# Patient Record
Sex: Female | Born: 1955 | Race: Black or African American | Hispanic: No | Marital: Single | Smoking: Never smoker
Health system: Southern US, Community
[De-identification: ages and names within clinical notes are randomized; demographics above are authoritative.]

## PROBLEM LIST (undated history)

## (undated) DIAGNOSIS — I10 Essential (primary) hypertension: Secondary | ICD-10-CM

## (undated) DIAGNOSIS — R51 Headache: Secondary | ICD-10-CM

## (undated) DIAGNOSIS — R519 Headache, unspecified: Secondary | ICD-10-CM

## (undated) HISTORY — DX: Essential (primary) hypertension: I10

---

## 2014-10-15 ENCOUNTER — Emergency Department (HOSPITAL_COMMUNITY)
Admission: EM | Admit: 2014-10-15 | Discharge: 2014-10-15 | Disposition: A | Discharge status: Home / Self Care | Payer: Medicaid Other | Attending: Emergency Medicine | Admitting: Emergency Medicine

## 2014-10-15 ENCOUNTER — Emergency Department (INDEPENDENT_AMBULATORY_CARE_PROVIDER_SITE_OTHER)
Admission: EM | Admit: 2014-10-15 | Discharge: 2014-10-15 | Disposition: A | Discharge status: Home / Self Care | Payer: Medicaid Other | Source: Home / Self Care | Attending: Family Medicine | Admitting: Family Medicine

## 2014-10-15 ENCOUNTER — Emergency Department (HOSPITAL_COMMUNITY): Payer: Medicaid Other

## 2014-10-15 ENCOUNTER — Encounter (HOSPITAL_COMMUNITY): Payer: Self-pay | Admitting: *Deleted

## 2014-10-15 ENCOUNTER — Encounter (HOSPITAL_COMMUNITY): Payer: Self-pay | Admitting: Emergency Medicine

## 2014-10-15 DIAGNOSIS — M25512 Pain in left shoulder: Secondary | ICD-10-CM | POA: Diagnosis not present

## 2014-10-15 DIAGNOSIS — J320 Chronic maxillary sinusitis: Secondary | ICD-10-CM | POA: Insufficient documentation

## 2014-10-15 DIAGNOSIS — R519 Headache, unspecified: Secondary | ICD-10-CM

## 2014-10-15 DIAGNOSIS — K5900 Constipation, unspecified: Secondary | ICD-10-CM | POA: Diagnosis not present

## 2014-10-15 DIAGNOSIS — M542 Cervicalgia: Secondary | ICD-10-CM | POA: Insufficient documentation

## 2014-10-15 DIAGNOSIS — K2970 Gastritis, unspecified, without bleeding: Secondary | ICD-10-CM | POA: Insufficient documentation

## 2014-10-15 DIAGNOSIS — G44021 Chronic cluster headache, intractable: Secondary | ICD-10-CM

## 2014-10-15 DIAGNOSIS — G8929 Other chronic pain: Secondary | ICD-10-CM | POA: Insufficient documentation

## 2014-10-15 DIAGNOSIS — R51 Headache: Secondary | ICD-10-CM | POA: Diagnosis present

## 2014-10-15 LAB — COMPREHENSIVE METABOLIC PANEL
ALBUMIN: 3.1 g/dL — AB (ref 3.5–5.0)
ALK PHOS: 117 U/L (ref 38–126)
ALT: 34 U/L (ref 14–54)
ANION GAP: 9 (ref 5–15)
AST: 49 U/L — ABNORMAL HIGH (ref 15–41)
BILIRUBIN TOTAL: 0.8 mg/dL (ref 0.3–1.2)
BUN: 12 mg/dL (ref 6–20)
CO2: 24 mmol/L (ref 22–32)
Calcium: 9.1 mg/dL (ref 8.9–10.3)
Chloride: 104 mmol/L (ref 101–111)
Creatinine, Ser: 0.46 mg/dL (ref 0.44–1.00)
GLUCOSE: 94 mg/dL (ref 65–99)
POTASSIUM: 3.9 mmol/L (ref 3.5–5.1)
Sodium: 137 mmol/L (ref 135–145)
Total Protein: 7.3 g/dL (ref 6.5–8.1)

## 2014-10-15 LAB — CBC WITH DIFFERENTIAL/PLATELET
BASOS ABS: 0 10*3/uL (ref 0.0–0.1)
Basophils Relative: 0 % (ref 0–1)
Eosinophils Absolute: 0.2 10*3/uL (ref 0.0–0.7)
Eosinophils Relative: 3 % (ref 0–5)
HEMATOCRIT: 44.3 % (ref 36.0–46.0)
HEMOGLOBIN: 15.3 g/dL — AB (ref 12.0–15.0)
LYMPHS PCT: 54 % — AB (ref 12–46)
Lymphs Abs: 3.1 10*3/uL (ref 0.7–4.0)
MCH: 30.4 pg (ref 26.0–34.0)
MCHC: 34.5 g/dL (ref 30.0–36.0)
MCV: 87.9 fL (ref 78.0–100.0)
Monocytes Absolute: 0.5 10*3/uL (ref 0.1–1.0)
Monocytes Relative: 8 % (ref 3–12)
NEUTROS ABS: 2 10*3/uL (ref 1.7–7.7)
NEUTROS PCT: 35 % — AB (ref 43–77)
Platelets: 171 10*3/uL (ref 150–400)
RBC: 5.04 MIL/uL (ref 3.87–5.11)
RDW: 12.9 % (ref 11.5–15.5)
WBC: 5.8 10*3/uL (ref 4.0–10.5)

## 2014-10-15 LAB — URINALYSIS, ROUTINE W REFLEX MICROSCOPIC
Bilirubin Urine: NEGATIVE
GLUCOSE, UA: NEGATIVE mg/dL
Hgb urine dipstick: NEGATIVE
Ketones, ur: NEGATIVE mg/dL
NITRITE: NEGATIVE
PROTEIN: NEGATIVE mg/dL
Specific Gravity, Urine: 1.012 (ref 1.005–1.030)
UROBILINOGEN UA: 1 mg/dL (ref 0.0–1.0)
pH: 6.5 (ref 5.0–8.0)

## 2014-10-15 LAB — URINE MICROSCOPIC-ADD ON

## 2014-10-15 MED ORDER — FLUTICASONE PROPIONATE 50 MCG/ACT NA SUSP: 2.0000 | Freq: Every day | NASAL | Status: DC

## 2014-10-15 MED ORDER — ESOMEPRAZOLE MAGNESIUM 40 MG PO CPDR: 40.0000 mg | DELAYED_RELEASE_CAPSULE | Freq: Every day | ORAL | Status: DC

## 2014-10-15 MED ORDER — ACETAMINOPHEN 325 MG PO TABS
650.0000 mg | ORAL_TABLET | Freq: Once | ORAL | Status: AC
  Administered 2014-10-15: 650 mg via ORAL
  Filled 2014-10-15: qty 2

## 2014-10-15 NOTE — Discharge Instructions (Signed)
Gastritis, Adult Gastritis is soreness and swelling (inflammation) of the lining of the stomach. Gastritis can develop as a sudden onset (acute) or long-term (chronic) condition. If gastritis is not treated, it can lead to stomach bleeding and ulcers. CAUSES  Gastritis occurs when the stomach lining is weak or damaged. Digestive juices from the stomach then inflame the weakened stomach lining. The stomach lining may be weak or damaged due to viral or bacterial infections. One common bacterial infection is the Helicobacter pylori infection. Gastritis can also result from excessive alcohol consumption, taking certain medicines, or having too much acid in the stomach.  SYMPTOMS  In some cases, there are no symptoms. When symptoms are present, they may include:  Pain or a burning sensation in the upper abdomen.  Nausea.  Vomiting.  An uncomfortable feeling of fullness after eating. DIAGNOSIS  Your caregiver may suspect you have gastritis based on your symptoms and a physical exam. To determine the cause of your gastritis, your caregiver may perform the following:  Blood or stool tests to check for the H pylori bacterium.  Gastroscopy. A thin, flexible tube (endoscope) is passed down the esophagus and into the stomach. The endoscope has a light and camera on the end. Your caregiver uses the endoscope to view the inside of the stomach.  Taking a tissue sample (biopsy) from the stomach to examine under a microscope. TREATMENT  Depending on the cause of your gastritis, medicines may be prescribed. If you have a bacterial infection, such as an H pylori infection, antibiotics may be given. If your gastritis is caused by too much acid in the stomach, H2 blockers or antacids may be given. Your caregiver may recommend that you stop taking aspirin, ibuprofen, or other nonsteroidal anti-inflammatory drugs (NSAIDs). HOME CARE INSTRUCTIONS  Only take over-the-counter or prescription medicines as directed by  your caregiver.  If you were given antibiotic medicines, take them as directed. Finish them even if you start to feel better.  Drink enough fluids to keep your urine clear or pale yellow.  Avoid foods and drinks that make your symptoms worse, such as:  Caffeine or alcoholic drinks.  Chocolate.  Peppermint or mint flavorings.  Garlic and onions.  Spicy foods.  Citrus fruits, such as oranges, lemons, or limes.  Tomato-based foods such as sauce, chili, salsa, and pizza.  Fried and fatty foods.  Eat small, frequent meals instead of large meals. SEEK IMMEDIATE MEDICAL CARE IF:   You have black or dark red stools.  You vomit blood or material that looks like coffee grounds.  You are unable to keep fluids down.  Your abdominal pain gets worse.  You have a fever.  You do not feel better after 1 week.  You have any other questions or concerns. MAKE SURE YOU:  Understand these instructions.  Will watch your condition.  Will get help right away if you are not doing well or get worse. Document Released: 04/12/2001 Document Revised: 10/18/2011 Document Reviewed: 06/01/2011 [facility] Patient Information 2015 [place], [place]. This information is not intended to replace advice given to you by your health care provider. Make sure you discuss any questions you have with your health care provider.  Sinusitis Sinusitis is redness, soreness, and inflammation of the paranasal sinuses. Paranasal sinuses are air pockets within the bones of your face (beneath the eyes, the middle of the forehead, or above the eyes). In healthy paranasal sinuses, mucus is able to drain out, and air is able to circulate through them by way  of your nose. However, when your paranasal sinuses are inflamed, mucus and air can become trapped. This can allow bacteria and other germs to grow and cause infection. Sinusitis can develop quickly and last only a short time (acute) or continue over a long period (chronic).  Sinusitis that lasts for more than 12 weeks is considered chronic.  CAUSES  Causes of sinusitis include:  Allergies.  Structural abnormalities, such as displacement of the cartilage that separates your nostrils (deviated septum), which can decrease the air flow through your nose and sinuses and affect sinus drainage.  Functional abnormalities, such as when the small hairs (cilia) that line your sinuses and help remove mucus do not work properly or are not present. SIGNS AND SYMPTOMS  Symptoms of acute and chronic sinusitis are the same. The primary symptoms are pain and pressure around the affected sinuses. Other symptoms include:  Upper toothache.  Earache.  Headache.  Bad breath.  Decreased sense of smell and taste.  A cough, which worsens when you are lying flat.  Fatigue.  Fever.  Thick drainage from your nose, which often is green and may contain pus (purulent).  Swelling and warmth over the affected sinuses. DIAGNOSIS  Your health care provider will perform a physical exam. During the exam, your health care provider may:  Look in your nose for signs of abnormal growths in your nostrils (nasal polyps).  Tap over the affected sinus to check for signs of infection.  View the inside of your sinuses (endoscopy) using an imaging device that has a light attached (endoscope). If your health care provider suspects that you have chronic sinusitis, one or more of the following tests may be recommended:  Allergy tests.  Nasal culture. A sample of mucus is taken from your nose, sent to a lab, and screened for bacteria.  Nasal cytology. A sample of mucus is taken from your nose and examined by your health care provider to determine if your sinusitis is related to an allergy. TREATMENT  Most cases of acute sinusitis are related to a viral infection and will resolve on their own within 10 days. Sometimes medicines are prescribed to help relieve symptoms (pain medicine,  decongestants, nasal steroid sprays, or saline sprays).  However, for sinusitis related to a bacterial infection, your health care provider will prescribe antibiotic medicines. These are medicines that will help kill the bacteria causing the infection.  Rarely, sinusitis is caused by a fungal infection. In theses cases, your health care provider will prescribe antifungal medicine. For some cases of chronic sinusitis, surgery is needed. Generally, these are cases in which sinusitis recurs more than 3 times per year, despite other treatments. HOME CARE INSTRUCTIONS   Drink plenty of water. Water helps thin the mucus so your sinuses can drain more easily.  Use a humidifier.  Inhale steam 3 to 4 times a day (for example, sit in the bathroom with the shower running).  Apply a warm, moist washcloth to your face 3 to 4 times a day, or as directed by your health care provider.  Use saline nasal sprays to help moisten and clean your sinuses.  Take medicines only as directed by your health care provider.  If you were prescribed either an antibiotic or antifungal medicine, finish it all even if you start to feel better. SEEK IMMEDIATE MEDICAL CARE IF:  You have increasing pain or severe headaches.  You have nausea, vomiting, or drowsiness.  You have swelling around your face.  You have vision problems.  You have a stiff neck.  You have difficulty breathing. MAKE SURE YOU:   Understand these instructions.  Will watch your condition.  Will get help right away if you are not doing well or get worse. Document Released: 04/18/2005 Document Revised: 09/02/2013 Document Reviewed: 05/03/2011 [facility] Patient Information 2015 [place], [place]. This information is not intended to replace advice given to you by your health care provider. Make sure you discuss any questions you have with your health care provider.

## 2014-10-15 NOTE — ED Notes (Signed)
Pt in c/o neck pain and headaches, went to urgent care for this and sent here for further evaluation, no distress noted

## 2014-10-15 NOTE — ED Provider Notes (Addendum)
CSN: [id number]     Arrival date & time 10/15/14  1437 History   First MD Initiated Contact with Patient 10/15/14 1552     Chief Complaint  Patient presents with  . Headache   (Consider location/radiation/quality/duration/timing/severity/associated sxs/prior Treatment) Patient is a 59 y.o. female presenting with general illness. The history is provided by the patient and the spouse.  Illness Severity:  Mild Onset quality:  Gradual Duration:  2 months Chronicity:  Chronic Context:  Sent from congregation nurse for mult medical issues. Associated symptoms: abdominal pain, chest pain, congestion and headaches     History reviewed. No pertinent past medical history. History reviewed. No pertinent past surgical history. No family history on file. History  Substance Use Topics  . Smoking status: Never Smoker   . Smokeless tobacco: Not on file  . Alcohol Use: No   OB History    No data available     Review of Systems  HENT: Positive for congestion.   Cardiovascular: Positive for chest pain.  Gastrointestinal: Positive for abdominal pain.  Genitourinary: Negative.   Neurological: Positive for weakness and headaches.    Allergies  Review of patient's allergies indicates no known allergies.  Home Medications   Prior to Admission medications   Medication Sig Start Date End Date Taking? Authorizing Provider  esomeprazole (NEXIUM) 40 MG capsule Take 1 capsule (40 mg total) by mouth daily. 10/15/14   Malvin Johns, MD  fluticasone (FLONASE) 50 MCG/ACT nasal spray Place 2 sprays into both nostrils daily. 10/15/14   Malvin Johns, MD   BP 150/81 mmHg  Pulse 72  Temp(Src) 97.9 F (36.6 C) (Oral)  Resp 16  SpO2 98% Physical Exam  Constitutional: She is oriented to person, place, and time. She appears well-developed and well-nourished. No distress.  Neurological: She is alert and oriented to person, place, and time.  Skin: Skin is warm and dry.  Nursing note and vitals  reviewed.   ED Course  Procedures (including critical care time) Labs Review Labs Reviewed - No data to display  Imaging Review No results found.   MDM  No diagnosis found. Sent for eval of mult medical issues.    Billy Fischer, MD 10/15/14 [place], MD 10/15/14 [place], MD 10/18/14 2055

## 2014-10-15 NOTE — ED Notes (Signed)
Provider bedside using translator phone as pt speaks Kinyar[place]anda.

## 2014-10-15 NOTE — ED Notes (Signed)
Multiple complaints: [place] congregational nurse sent patient to ucc.  C/o neck pain, shoulder pain, intermittent fingers numb and tingling.  C/o stomach problems, c/o headaches.  Congregational nurse includes insomnia and reflux and liver problems.

## 2014-10-16 NOTE — ED Provider Notes (Signed)
CSN: [id number]     Arrival date & time 10/15/14  1635 History   First MD Initiated Contact with Patient 10/15/14 2000     Chief Complaint  Patient presents with  . Headache     (Consider location/radiation/quality/duration/timing/severity/associated sxs/prior Treatment) HPI Comments: Patient presents with a headache and stomach pain. She is from [place] and has been here for about one month. She does not speak much [place]. History was obtained through the language line. She reports a headache it's been worsening over the last 2 months. She states that she's had similar headaches for about 5 years. They seem to be a little worse over the last 2 months. She has daily headaches. It's a bifrontal-type headache and she has some facial tenderness as well. She denies any fevers. She does have some pain to the left side of her neck radiating down her left arm. She denies any weakness in the arm. She states it hurts whenever she tries to move her arm. Has been going on about 2 months. She also reports some chronic pain in her upper abdomen. She reports it has been going on for several months as well. She was treated with medications in [place] but doesn't have any medications here. She denies any nausea vomiting or diarrhea. She does report some constipation. She denies any urinary symptoms. She denies any fevers or chills. She was seen [facility]. She was sent over here for further evaluation.  Patient is a 59 y.o. female presenting with headaches.  Headache Associated symptoms: abdominal pain and neck pain   Associated symptoms: no back pain, no congestion, no cough, no diarrhea, no dizziness, no fatigue, no fever, no nausea, no numbness, no vomiting and no weakness     History reviewed. No pertinent past medical history. History reviewed. No pertinent past surgical history. History reviewed. No pertinent family history. History  Substance Use Topics  . Smoking status:  Never Smoker   . Smokeless tobacco: Not on file  . Alcohol Use: No   OB History    No data available     Review of Systems  Constitutional: Negative for fever, chills, diaphoresis and fatigue.  HENT: Negative for congestion, rhinorrhea and sneezing.   Eyes: Negative.   Respiratory: Negative for cough, chest tightness and shortness of breath.   Cardiovascular: Negative for chest pain and leg swelling.  Gastrointestinal: Positive for abdominal pain. Negative for nausea, vomiting, diarrhea and blood in stool.  Genitourinary: Negative for frequency, hematuria, flank pain and difficulty urinating.  Musculoskeletal: Positive for neck pain. Negative for back pain and arthralgias.  Skin: Negative for rash.  Neurological: Positive for headaches. Negative for dizziness, speech difficulty, weakness and numbness.      Allergies  Review of patient's allergies indicates no known allergies.  Home Medications   Prior to Admission medications   Medication Sig Start Date End Date Taking? Authorizing Provider  esomeprazole (NEXIUM) 40 MG capsule Take 1 capsule (40 mg total) by mouth daily. 10/15/14   Malvin Johns, MD  fluticasone (FLONASE) 50 MCG/ACT nasal spray Place 2 sprays into both nostrils daily. 10/15/14   Malvin Johns, MD   BP 133/85 mmHg  Pulse 73  Temp(Src) 97.9 F (36.6 C) (Oral)  Resp 16  SpO2 100% Physical Exam  Constitutional: She is oriented to person, place, and time. She appears well-developed and well-nourished.  HENT:  Head: Normocephalic and atraumatic.  Eyes: Pupils are equal, round, and reactive to light.  Neck: Normal range of  motion. Neck supple.  Tenderness along the left trapezius muscle. No spinal tenderness noted.  Cardiovascular: Normal rate, regular rhythm and normal heart sounds.   Pulmonary/Chest: Effort normal and breath sounds normal. No respiratory distress. She has no wheezes. She has no rales. She exhibits no tenderness.  Abdominal: Soft. Bowel sounds  are normal. There is tenderness (Mild tenderness to epigastrium). There is no rebound and no guarding.  Musculoskeletal: Normal range of motion. She exhibits no edema.  Lymphadenopathy:    She has no cervical adenopathy.  Neurological: She is alert and oriented to person, place, and time. She has normal strength. No cranial nerve deficit or sensory deficit. GCS eye subscore is 4. GCS verbal subscore is 5. GCS motor subscore is 6.  Skin: Skin is warm and dry. No rash noted.  Psychiatric: She has a normal mood and affect.    ED Course  Procedures (including critical care time) Labs Review Labs Reviewed  COMPREHENSIVE METABOLIC PANEL - Abnormal; Notable for the following:    Albumin 3.1 (*)    AST 49 (*)    All other components within normal limits  CBC WITH DIFFERENTIAL/PLATELET - Abnormal; Notable for the following:    Hemoglobin 15.3 (*)    Neutrophils Relative % 35 (*)    Lymphocytes Relative 54 (*)    All other components within normal limits  URINALYSIS, ROUTINE W REFLEX MICROSCOPIC (NOT AT [facility]) - Abnormal; Notable for the following:    Leukocytes, UA TRACE (*)    All other components within normal limits  URINE MICROSCOPIC-ADD ON - Abnormal; Notable for the following:    Squamous Epithelial / LPF FEW (*)    All other components within normal limits    Imaging Review Ct Head Wo Contrast  10/15/2014   CLINICAL DATA:  Frontal headache running down both sides effaced.  EXAM: CT HEAD WITHOUT CONTRAST  TECHNIQUE: Contiguous axial images were obtained from the base of the skull through the vertex without intravenous contrast.  COMPARISON:  None.  FINDINGS: Mild cerebral atrophy. No ventricular dilatation. No mass effect or midline shift. No abnormal extra-axial fluid collections. Gray-white matter junctions are distinct. Basal cisterns are not effaced. No evidence of acute intracranial hemorrhage. No depressed skull fractures. Mucosal thickening in the paranasal sinuses. Suggestion of a  possible air-fluid level in the left maxillary antrum, incompletely visualized. This may indicate sinusitis.  IMPRESSION: No acute intracranial abnormalities. Inflammatory changes in the paranasal sinuses with possible acute air-fluid level in the left maxillary antrum.   Electronically Signed   By: Lucienne Capers M.D.   On: 10/15/2014 21:46     EKG Interpretation None      MDM   Final diagnoses:  Headache, unspecified headache type  Chronic maxillary sinusitis  Gastritis    Patient presents with issues that sound chronic in nature. She has chronic headaches and has evidence of sinus disease on her head CT. I will go ahead and treat her with Flonase for this. She doesn't have any other symptoms that would be more indicative of an acute sinusitis. She also has some epigastric pain which she's had for several years. Her symptoms sound suspicious for gastritis. Her labs are unremarkable other than some mild elevation in her LFTs which she states she's had in the past. I will start her on a PPI. I encouraged her to follow back up at the health clinic and return here as needed for any worsening symptoms.    Malvin Johns, MD 10/16/14 [phone]

## 2014-10-27 ENCOUNTER — Ambulatory Visit (INDEPENDENT_AMBULATORY_CARE_PROVIDER_SITE_OTHER): Payer: Medicaid Other | Admitting: Family Medicine

## 2014-10-27 VITALS — BP 132/74 | HR 87 | Temp 98.1°F | Ht 64.0 in | Wt 162.0 lb

## 2014-10-27 DIAGNOSIS — R1013 Epigastric pain: Secondary | ICD-10-CM

## 2014-10-27 DIAGNOSIS — R079 Chest pain, unspecified: Secondary | ICD-10-CM | POA: Diagnosis not present

## 2014-10-27 DIAGNOSIS — Z008 Encounter for other general examination: Secondary | ICD-10-CM | POA: Diagnosis not present

## 2014-10-27 DIAGNOSIS — Z0289 Encounter for other administrative examinations: Secondary | ICD-10-CM

## 2014-10-27 DIAGNOSIS — E049 Nontoxic goiter, unspecified: Secondary | ICD-10-CM | POA: Diagnosis not present

## 2014-10-27 DIAGNOSIS — Z0000 Encounter for general adult medical examination without abnormal findings: Secondary | ICD-10-CM

## 2014-10-27 DIAGNOSIS — I10 Essential (primary) hypertension: Secondary | ICD-10-CM

## 2014-10-27 MED ORDER — ESOMEPRAZOLE MAGNESIUM 40 MG PO CPDR: 40.0000 mg | DELAYED_RELEASE_CAPSULE | Freq: Every day | ORAL | Status: DC

## 2014-10-27 MED ORDER — ACETAMINOPHEN ER 650 MG PO TBCR: 650.0000 mg | EXTENDED_RELEASE_TABLET | Freq: Three times a day (TID) | ORAL | Status: DC | PRN

## 2014-10-27 NOTE — Progress Notes (Signed)
Patient ID: Stephanie Swanson, female   DOB: [birth year], 58 y.o.   MRN: [id number] [place] from [place] utilized during today's visit.  [place] [facility] Patient Visit  HPI:  Patient presents to [facility] today for a new patient appointment to establish general primary care, also to discuss abdominal pain.  Abdominal pain: notes spasm discomfort in upper abdomen, lower ribs, and chest. Notes some radiation of pain to her left arm with this that is very painful. Describes the abdominal pain as burning followed by spasm. States this improves if she places pressure in the area. Notes pain is worse with starchy foods and now with soft vegetables. Notes the CP is spasm in nature and points to her bilateral breasts with this. Notes this can occur with activity and at rest. Has some intermittent dyspnea with this. Some diaphoresis prior to the CP occurring. Notes some nausea, though no vomiting or diarrhea. Also notes that she has sensation that food gets stuck in her throat though has not had regurgitation and is always able to swallow what she eats. Has been on omeprazole for this with some benefit.  ROS: See HPI  Immigrant Social History: - Name spelling correct?: yes - Date arrived in [place]: 09/11/14 - Country of origin: [place] - Location of refugee camp (if applicable), how long there, and what caused patient to leave home country?: was in refugee camp is [place]. Was there a long time. Left because of the war. - Primary language: kinyarwanda  -Requires intepreter (essentially speaks no [place]) - Education: Highest level of education: no schooling - Prior work: Psychologist, sport and exercise - Games developer name and number: [id number] - Tobacco/alcohol/drug use: no - Marriage Status: widowed - Sexual activity: no - Were you beaten or tortured in your country or refugee camp?  no  - if yes:  Are you having bad dreams about your experience?     Do you feel "jumpy" or "nervous?"     Do you  feel that the experience is happening again?     Are you "super alert" or watchful?   Preventative Care History: -Seen at health department?: yes -has had blood drawn and had immunizations  Past Medical Hx:  -history of stomach issues  Past Surgical Hx:  -no  Family Hx: updated in Epic - Number of family members in [place]:  2 daughters, one son  PHYSICAL EXAM: BP 154/78 mmHg  Pulse 87  Temp(Src) 98.1 F (36.7 C) (Oral)  Ht 5\' 4"  (1.626 m)  Wt 162 lb (73.483 kg)  BMI 27.79 kg/m2 Gen: NAD, resting comfortably on the exam table HEENT: NCAT, MMM, diffusely enlarged goiter noted, no nodules palpated, no OP erythema, no tonsillar swelling Neck:  Supple, goiter noted, no LAD Heart: rrr, no murmur aprpeciated Lungs: CTAB, nml WOB Abdomen: s, mild epigastric TTP, no other tenderness, no guarding or rebound, ND, no HSM noted Skin:  No rash noted MSK: TTP over bilateral lower ribs and over bilateral costochondral joints Neuro: alert, moves all extremities equally Psych: normal affect  Examined and interviewed with Dr. Mingo Amber  FOLLOW UP: F/u in 2 weeks for abdominal discomfort.  Epigastric discomfort Patient with epigastric discomfort and associated chest discomfort most likely related to gastritis and reflux. No peritoneal signs on exam today. EKG performed and reassuring for non-cardiac cause of discomfort. Could also be a muscular issue given TTP of ribs and costochondral joints with spasm sensation. Will check h pylori screen today. Treat if positive. Will continue PPI. Tylenol for possible MSK  component. Given return precautions.   Goiter Goiter noted on exam. Many of her symptoms could be a result of a thyroid issue. Has been seen at HD previously so will request records from them to see what work-up has been initiated to this point.

## 2014-10-27 NOTE — Patient Instructions (Signed)
Nice to meet you. We are going to continue a reflux medication for your abdominal discomfort.  We are going to check for a bacteria that could be causing your pain. We will request your records from the health department.  Please take tylenol as prescribed for pain. If you develop chest pain, shortness of breath, trouble swallowing, abdominal pain, fever, diarrhea, or vomiting please seek medical attention.

## 2014-10-28 DIAGNOSIS — R1013 Epigastric pain: Secondary | ICD-10-CM | POA: Insufficient documentation

## 2014-10-28 DIAGNOSIS — E049 Nontoxic goiter, unspecified: Secondary | ICD-10-CM | POA: Insufficient documentation

## 2014-10-28 DIAGNOSIS — Z0289 Encounter for other administrative examinations: Secondary | ICD-10-CM | POA: Insufficient documentation

## 2014-10-28 NOTE — Assessment & Plan Note (Addendum)
Patient with epigastric discomfort and associated chest discomfort most likely related to gastritis and reflux. No peritoneal signs on exam today. EKG performed and reassuring for non-cardiac cause of discomfort. Could also be a muscular issue given TTP of ribs and costochondral joints with spasm sensation. Will check h pylori screen today. Treat if positive. Will continue PPI. Tylenol for possible MSK component. Given return precautions.

## 2014-10-28 NOTE — Assessment & Plan Note (Addendum)
Goiter noted on exam. Many of her symptoms could be a result of a thyroid issue. Has been seen at HD previously so will request records from them to see what work-up has been initiated to this point.   Addendum:  Borderline low TSH received from HD:  0.811.  T4 was 12.1  Will repeat and refer for [place] and possible endocrinology next visit.

## 2014-10-29 DIAGNOSIS — Z0000 Encounter for general adult medical examination without abnormal findings: Secondary | ICD-10-CM | POA: Insufficient documentation

## 2014-10-29 DIAGNOSIS — I10 Essential (primary) hypertension: Secondary | ICD-10-CM | POA: Insufficient documentation

## 2014-11-21 ENCOUNTER — Encounter: Payer: Self-pay | Admitting: Family Medicine

## 2014-11-21 ENCOUNTER — Ambulatory Visit (INDEPENDENT_AMBULATORY_CARE_PROVIDER_SITE_OTHER): Payer: Medicaid Other | Admitting: Family Medicine

## 2014-11-21 VITALS — BP 138/82 | HR 73 | Temp 98.9°F | Ht 64.0 in | Wt 164.0 lb

## 2014-11-21 DIAGNOSIS — R51 Headache: Secondary | ICD-10-CM | POA: Diagnosis not present

## 2014-11-21 DIAGNOSIS — R519 Headache, unspecified: Secondary | ICD-10-CM | POA: Insufficient documentation

## 2014-11-21 DIAGNOSIS — E049 Nontoxic goiter, unspecified: Secondary | ICD-10-CM

## 2014-11-21 DIAGNOSIS — R1013 Epigastric pain: Secondary | ICD-10-CM

## 2014-11-21 DIAGNOSIS — I10 Essential (primary) hypertension: Secondary | ICD-10-CM

## 2014-11-21 LAB — CBC
HEMATOCRIT: 45.2 % (ref 36.0–46.0)
HEMOGLOBIN: 15.9 g/dL — AB (ref 12.0–15.0)
MCH: 31.2 pg (ref 26.0–34.0)
MCHC: 35.2 g/dL (ref 30.0–36.0)
MCV: 88.8 fL (ref 78.0–100.0)
MPV: 11.9 fL (ref 8.6–12.4)
PLATELETS: 221 10*3/uL (ref 150–400)
RBC: 5.09 MIL/uL (ref 3.87–5.11)
RDW: 13.6 % (ref 11.5–15.5)
WBC: 4.9 10*3/uL (ref 4.0–10.5)

## 2014-11-21 LAB — POCT URINALYSIS DIPSTICK
Bilirubin, UA: NEGATIVE
GLUCOSE UA: NEGATIVE
KETONES UA: NEGATIVE
Nitrite, UA: NEGATIVE
Protein, UA: NEGATIVE
RBC UA: NEGATIVE
Spec Grav, UA: 1.02
Urobilinogen, UA: 1
pH, UA: 8.5

## 2014-11-21 LAB — COMPREHENSIVE METABOLIC PANEL
ALT: 34 U/L (ref 0–35)
AST: 47 U/L — ABNORMAL HIGH (ref 0–37)
Albumin: 3.7 g/dL (ref 3.5–5.2)
Alkaline Phosphatase: 118 U/L — ABNORMAL HIGH (ref 39–117)
BUN: 12 mg/dL (ref 6–23)
CO2: 26 meq/L (ref 19–32)
Calcium: 9.4 mg/dL (ref 8.4–10.5)
Chloride: 101 mEq/L (ref 96–112)
Creat: 0.54 mg/dL (ref 0.50–1.10)
Glucose, Bld: 88 mg/dL (ref 70–99)
Potassium: 4.2 mEq/L (ref 3.5–5.3)
Sodium: 139 mEq/L (ref 135–145)
Total Bilirubin: 0.7 mg/dL (ref 0.2–1.2)
Total Protein: 8.1 g/dL (ref 6.0–8.3)

## 2014-11-21 LAB — POCT UA - MICROSCOPIC ONLY

## 2014-11-21 LAB — POCT H PYLORI SCREEN: H Pylori Screen, POC: POSITIVE

## 2014-11-21 MED ORDER — FLUTICASONE PROPIONATE 50 MCG/ACT NA SUSP: 2.0000 | Freq: Every day | NASAL | Status: DC

## 2014-11-21 MED ORDER — ESOMEPRAZOLE MAGNESIUM 40 MG PO CPDR: 40.0000 mg | DELAYED_RELEASE_CAPSULE | Freq: Every day | ORAL | Status: DC

## 2014-11-21 MED ORDER — ACETAMINOPHEN ER 650 MG PO TBCR: 650.0000 mg | EXTENDED_RELEASE_TABLET | Freq: Three times a day (TID) | ORAL | Status: DC | PRN

## 2014-11-21 NOTE — Progress Notes (Signed)
Subjective:   [name] provided interpretation today for entire visit.  Stephanie Swanson is a 59 y.o. female who presents to [facility] today for several issues:  1.  Abdominal pain:  Also with bloating sensation. Food makes it better, but then pain recurs. Occasional dysphagia/globus sensation. Was taking Nexium but ran out of this 3 days ago and doesn't have any more.  Pain worsened after she ran out of Nexium.  Has nausea without overt vomiting.  States she "spits" a lot.  Occasional blood in AM when she first rinses her mouth.  Denies diarrhea or constipation, but doesn't have BM everyday.    2.  Headaches:  "hurts all the time" in front of face and under her ears.  Has trouble sleeping secondary to this.  Seen in ED with negative CT scan.  Was given Tylenol last visit but didn't pick this up.  She was also given Flonase at last visit and states this helped some with pain.    3.  Goiter: Has temperature intolerance.  NO noticeable weight gain or loss. Occasionally with calf cramps. No LE edema.  Denies palpitations.    ROS as above per HPI.  Occasinally has chest pain when she has abdominal pain, described as "burning sensation" not affected by exertion.  Sometimes worsened with food.   The following portions of the patient's history were reviewed and updated as appropriate: allergies, current medications, past medical history, family and social history, and problem list. Patient is a nonsmoker.    PMH reviewed.  No past medical history on file. No past surgical history on file.  Medications reviewed. Current Outpatient Prescriptions  Medication Sig Dispense Refill  . acetaminophen (TYLENOL) 650 MG CR tablet Take 1 tablet (650 mg total) by mouth every 8 (eight) hours as needed for pain. 90 tablet 2  . esomeprazole (NEXIUM) 40 MG capsule Take 1 capsule (40 mg total) by mouth daily. 30 capsule 3  . fluticasone (FLONASE) 50 MCG/ACT nasal spray Place 2 sprays into both nostrils daily. 16  g 2   No current facility-administered medications for this visit.     Objective:   Physical Exam BP 154/76 mmHg  Pulse 73  Temp(Src) 98.9 F (37.2 C) (Oral)  Ht 5\' 4"  (1.626 m)  Wt 164 lb (74.39 kg)  BMI 28.14 kg/m2 Gen:  Alert, cooperative patient who appears stated age in no acute distress.  Vital signs reviewed. HEENT: EOMI,  MMM Neck:  Goiter noted.  Nontender.  Diffuse Cardiac:  Regular rate and rhythm  Pulm:  Clear to auscultation bilaterally    Abd:  Soft/nondistended/epigastric and some BL upper quadrant tenderness.  Worse on Right.   Exts: Trace BL ankle edema.   No results found for this or any previous visit (from the past 72 hour(s)).

## 2014-11-21 NOTE — Assessment & Plan Note (Signed)
TSH and thyroid US today. Possible Endo referral pending results.

## 2014-11-21 NOTE — Assessment & Plan Note (Signed)
No red flags - has already had negative CT scan. Has not tried any analgesics. Plan Tylenol today. FU in 51month to assess for improvement.  Sooner if worsening. States Flonase helped as well -- possible sinus congestion?

## 2014-11-21 NOTE — Assessment & Plan Note (Addendum)
Checking H pylori today.  Did not happen last visit for some reason. U/A today as well, occasionally with mild lower abdominal tenderness. Cannot palpate spleen on her.   Sending for abdominal US today as well.  There is a concern for splenomegaly in [place] refugees. I will call patient's daughter with results and she will relay them to the patient.  Spent 30 minutes in direct patient care/face to face encounter.

## 2014-11-21 NOTE — Patient Instructions (Addendum)
Take the Esomeprazole for your stomach.  Pick up a refill when you run out of pills.    Take the Tylenol up to 3 times a day if you have a headache.    Refill for Flonase use this 1 spray in each nostril once a day.    I will call your daughter with the test results sometime next week.

## 2014-11-21 NOTE — Assessment & Plan Note (Signed)
Better on recheck 

## 2014-11-22 LAB — T3, FREE: T3 FREE: 3.2 pg/mL (ref 2.3–4.2)

## 2014-11-22 LAB — T4, FREE: Free T4: 0.95 ng/dL (ref 0.80–1.80)

## 2014-11-22 LAB — TSH: TSH: 0.791 u[IU]/mL (ref 0.350–4.500)

## 2014-12-05 ENCOUNTER — Ambulatory Visit (HOSPITAL_COMMUNITY)
Admission: RE | Admit: 2014-12-05 | Discharge: 2014-12-05 | Disposition: A | Discharge status: Home / Self Care | Payer: Medicaid Other | Source: Ambulatory Visit | Attending: Family Medicine | Admitting: Family Medicine

## 2014-12-05 DIAGNOSIS — E049 Nontoxic goiter, unspecified: Secondary | ICD-10-CM

## 2014-12-05 DIAGNOSIS — R1012 Left upper quadrant pain: Secondary | ICD-10-CM | POA: Diagnosis not present

## 2014-12-05 DIAGNOSIS — R1013 Epigastric pain: Secondary | ICD-10-CM | POA: Insufficient documentation

## 2014-12-11 ENCOUNTER — Telehealth: Payer: Self-pay | Admitting: Family Medicine

## 2014-12-11 NOTE — Telephone Encounter (Signed)
Called to discuss lab results with daughter.  Difficulty with thick accent -- both hers and mine.  Long story short, the patient will be returning to see me on 8/23.  Will treat H pylori at that visit.  Will review other labs with her then.

## 2014-12-12 ENCOUNTER — Telehealth: Payer: Self-pay | Admitting: Family Medicine

## 2014-12-12 NOTE — Telephone Encounter (Signed)
Caseworker with [facility] brought in dental form to be completed Will pick up when ready

## 2014-12-12 NOTE — Telephone Encounter (Signed)
Form placed in PCP box. Zimmerman Rumple, Kaytelyn Glore D, CMA  

## 2014-12-15 NOTE — Telephone Encounter (Signed)
Completed and given to [employer]

## 2014-12-16 NOTE — Telephone Encounter (Signed)
Called Aline to inform her that form is complete and ready for pick up.  Phone call was disconnected.  Derl Barrow, RN

## 2014-12-23 ENCOUNTER — Ambulatory Visit (INDEPENDENT_AMBULATORY_CARE_PROVIDER_SITE_OTHER): Payer: Medicaid Other | Admitting: Family Medicine

## 2014-12-23 ENCOUNTER — Encounter: Payer: Self-pay | Admitting: Family Medicine

## 2014-12-23 VITALS — BP 131/73 | HR 73 | Temp 98.0°F | Ht 64.0 in | Wt 165.7 lb

## 2014-12-23 DIAGNOSIS — R1013 Epigastric pain: Secondary | ICD-10-CM

## 2014-12-23 DIAGNOSIS — M25512 Pain in left shoulder: Secondary | ICD-10-CM

## 2014-12-23 DIAGNOSIS — E049 Nontoxic goiter, unspecified: Secondary | ICD-10-CM

## 2014-12-23 MED ORDER — AMOXICILL-CLARITHRO-LANSOPRAZ PO MISC: Freq: Two times a day (BID) | ORAL | Status: DC

## 2014-12-23 NOTE — Patient Instructions (Addendum)
Take each medicine twice daily.  It is three medicines total.  I am sending you for an xray of your shoulder.  -------------------------------------------------------------------------------  I DO NOT SPEAK ENGLISH.  MY DOCTOR WANTS ME TO HAVE A SHOULDER XRAY.  PLEASE HELP ME FIND A [facility] INTERPRETER AND THE RADIOLOGY DEPARTMENT.

## 2014-12-25 DIAGNOSIS — M25512 Pain in left shoulder: Secondary | ICD-10-CM | POA: Insufficient documentation

## 2014-12-25 NOTE — Assessment & Plan Note (Signed)
H pylori found on last visit. Tried to call daughter over the phone, who recommended we discuss in person.  Daughter's husband is present today.  Explained what this meant.   Treat with triple therapy.   FU in ~1 month to assess for improvement.

## 2014-12-25 NOTE — Assessment & Plan Note (Addendum)
Heterogeneously enlarged goiter based on ultrasound results.  No dysphagia or difficulty with airway. Normal TSH and thyroid testing.   Asymptomatic euthyroid patients Follow.  If worsens, will refer to endocrinology.

## 2014-12-25 NOTE — Progress Notes (Signed)
Subjective:   Phone Swahili interpreter Jeneen Rinks through [employer] used today:  Stephanie Swanson is a 59 y.o. female who presents to [facility] today for 2 issues:  1.  Abdominal Pain:  Persistent.  Epigastric.  No N/V. Moving bowels well.  Pain worse after eating.  Describes as sharp stabbing.  Evidently did not pick up the PPI from last visit.    2.  Left shoulder pain:  Worse when using her Left arm or trying to life above her head.  Has been constant for several months.  Relieved when she takes Tylenol.  She is worried the pain is from her liver.  Was evidently told she had trouble with her liver while in [place].  No redness or swelling of shoulder.    ROS as above per HPI, otherwise neg.    The following portions of the patient's history were reviewed and updated as appropriate: allergies, current medications, past medical history, family and social history, and problem list. Patient is a nonsmoker.    PMH reviewed.  No past medical history on file. No past surgical history on file.  Medications reviewed. Current Outpatient Prescriptions  Medication Sig Dispense Refill  . acetaminophen (TYLENOL) 650 MG CR tablet Take 1 tablet (650 mg total) by mouth every 8 (eight) hours as needed for pain. 90 tablet 2  . amoxicillin-clarithromycin-lansoprazole (PREVPAC) combo pack Take by mouth 2 (two) times daily. Follow package directions. 1 kit 0  . esomeprazole (NEXIUM) 40 MG capsule Take 1 capsule (40 mg total) by mouth daily. 30 capsule 3  . fluticasone (FLONASE) 50 MCG/ACT nasal spray Place 2 sprays into both nostrils daily. 16 g 2   No current facility-administered medications for this visit.     Objective:   Physical Exam BP 131/73 mmHg  Pulse 73  Temp(Src) 98 F (36.7 C) (Oral)  Ht 5' 4"  (1.626 m)  Wt 165 lb 11.2 oz (75.161 kg)  BMI 28.43 kg/m2 Gen:  Alert, cooperative patient who appears stated age in no acute distress.  Vital signs reviewed. HEENT: EOMI,  MMM Cardiac:   Regular rate and rhythm  Pulm:  Clear to auscultation bilaterally   Abd:  Soft/nondistended.  TTP in epigastrum.  No RUQ pain.  No guarding or rebound.   MSK:  Some deltoid atrophy on Left.  No redness or effusion palpated.  Difficulty complying with MSK testing secondary both to phone interpreter and pain.  Some mile pain with PROM, moderate to severe with AROM.   Exts: No LE edema  No results found for this or any previous visit (from the past 72 hour(s)).

## 2014-12-25 NOTE — Assessment & Plan Note (Signed)
Difficult to assess with physical exam.  Does have some atrophy. Most likely etiology would be rotator cuff pathology.   Pain relief and strengthening exercises.  Sending for xrays due to possibility of trauma as prior refugee.   Will call with results -- discussed this with her and she asked to be called with Swahili interpreter.

## 2015-01-08 ENCOUNTER — Ambulatory Visit (HOSPITAL_COMMUNITY)
Admission: RE | Admit: 2015-01-08 | Discharge: 2015-01-08 | Disposition: A | Discharge status: Home / Self Care | Payer: Medicaid Other | Source: Ambulatory Visit | Attending: Family Medicine | Admitting: Family Medicine

## 2015-01-08 DIAGNOSIS — M25512 Pain in left shoulder: Secondary | ICD-10-CM | POA: Diagnosis present

## 2015-01-19 ENCOUNTER — Encounter: Payer: Self-pay | Admitting: Family Medicine

## 2015-01-27 ENCOUNTER — Ambulatory Visit: Payer: Medicaid Other | Admitting: Family Medicine

## 2015-01-30 ENCOUNTER — Ambulatory Visit (INDEPENDENT_AMBULATORY_CARE_PROVIDER_SITE_OTHER): Payer: Medicaid Other | Admitting: Family Medicine

## 2015-01-30 ENCOUNTER — Encounter: Payer: Self-pay | Admitting: Family Medicine

## 2015-01-30 VITALS — BP 139/77 | HR 77 | Temp 98.1°F | Ht 64.0 in | Wt 170.3 lb

## 2015-01-30 DIAGNOSIS — R1013 Epigastric pain: Secondary | ICD-10-CM

## 2015-01-30 DIAGNOSIS — G8929 Other chronic pain: Secondary | ICD-10-CM

## 2015-01-30 DIAGNOSIS — R51 Headache: Secondary | ICD-10-CM

## 2015-01-30 DIAGNOSIS — M25512 Pain in left shoulder: Secondary | ICD-10-CM | POA: Diagnosis not present

## 2015-01-30 DIAGNOSIS — IMO0001 Reserved for inherently not codable concepts without codable children: Secondary | ICD-10-CM

## 2015-01-30 DIAGNOSIS — R030 Elevated blood-pressure reading, without diagnosis of hypertension: Secondary | ICD-10-CM | POA: Diagnosis not present

## 2015-01-30 DIAGNOSIS — R519 Headache, unspecified: Secondary | ICD-10-CM

## 2015-01-30 MED ORDER — FLUTICASONE PROPIONATE 50 MCG/ACT NA SUSP: 2.0000 | Freq: Every day | NASAL | Status: DC

## 2015-01-30 MED ORDER — ESOMEPRAZOLE MAGNESIUM 40 MG PO CPDR: 40.0000 mg | DELAYED_RELEASE_CAPSULE | Freq: Every day | ORAL | Status: DC

## 2015-01-30 NOTE — Assessment & Plan Note (Signed)
Persists.   No improvement despite triple therapy for H pylori. I think at this point she warrants further GI evaluation and possible EGD.   Elevated alk phos and gallbladder thickening noted.  Describes pain as bilateral upper quadrants and not consistent with usual gallbladder pain.

## 2015-01-30 NOTE — Progress Notes (Signed)
Subjective:    Stephanie Swanson is a 59 y.o. female who presents to [facility] today for several issues:  1.  Abdominal pain:  Persists. Diagnosed with H.pylori at previous visit.   Treated for this last visit. States she has finished the medicine but still with pain.  She reports she is eating but not as much as she usually does.  Worse with spicy food.  Feels burning in epigastrum.  Some nausea, but no vomiting.    2. Headache:  Bifrontal headache most days of the week.  Worse around her eyes.  Better when she takes nexium.  No changes in vision.  No N/V. No photophobia/phonophobia.    ROS as above per HPI, otherwise neg.    The following portions of the patient's history were reviewed and updated as appropriate: allergies, current medications, past medical history, family and social history, and problem list. Patient is a nonsmoker.    PMH reviewed.  No past medical history on file. No past surgical history on file.  Medications reviewed. Current Outpatient Prescriptions  Medication Sig Dispense Refill  . acetaminophen (TYLENOL) 650 MG CR tablet Take 1 tablet (650 mg total) by mouth every 8 (eight) hours as needed for pain. 90 tablet 2  . amoxicillin-clarithromycin-lansoprazole (PREVPAC) combo pack Take by mouth 2 (two) times daily. Follow package directions. 1 kit 0  . esomeprazole (NEXIUM) 40 MG capsule Take 1 capsule (40 mg total) by mouth daily. 30 capsule 3  . fluticasone (FLONASE) 50 MCG/ACT nasal spray Place 2 sprays into both nostrils daily. 16 g 2   No current facility-administered medications for this visit.     Objective:   Physical Exam BP 139/77 mmHg  Pulse 77  Temp(Src) 98.1 F (36.7 C) (Oral)  Ht _0  (1.626 m)  Wt 170 lb 4.8 oz (77.248 kg)  BMI 29.22 kg/m2 Gen:  Alert, cooperative patient who appears stated age in no acute distress.  Vital signs reviewed. HEENT: EOMI,  MMM Cardiac:  Regular rate and rhythm without murmur auscultated.  Good S1/S2. Pulm:  Clear  to auscultation bilaterally with good air movement.  No wheezes or rales noted.   Abd:  Soft/nondistended.  TTP epigastrum, RUQ, LUQ.  No guarding or rebound.  Otherwise abdomen benign.       No results found for this or any previous visit (from the past 72 hour(s)).

## 2015-01-30 NOTE — Patient Instructions (Signed)
It was good to see you again today.  I have sent in refills for your ulcer and nose medicine.  I am sending you to a stomach doctor today as well.  They will call you with an appointment.

## 2015-01-30 NOTE — Assessment & Plan Note (Signed)
States better with nexium.  Possibly she means Tylenol -- or this is secondary to ulcer/referred pain and truly is better when she takes nexium.  No red flags.   Warning precautions provided.  Recommended restarting Tylenol if pain recurs to see if it helps.

## 2015-01-30 NOTE — Assessment & Plan Note (Signed)
Better with Tylenol. Reviewed xray's with her today and that this is likely MSK pain in origin.

## 2015-01-30 NOTE — Assessment & Plan Note (Signed)
No actual HTN diagnosis.   Monitor.

## 2015-02-02 ENCOUNTER — Encounter: Payer: Self-pay | Admitting: Gastroenterology

## 2015-03-30 ENCOUNTER — Ambulatory Visit (INDEPENDENT_AMBULATORY_CARE_PROVIDER_SITE_OTHER): Payer: Self-pay | Admitting: Gastroenterology

## 2015-03-30 DIAGNOSIS — G8929 Other chronic pain: Secondary | ICD-10-CM

## 2015-03-30 DIAGNOSIS — R1013 Epigastric pain: Secondary | ICD-10-CM

## 2015-03-30 NOTE — Progress Notes (Signed)
No show

## 2015-04-01 ENCOUNTER — Encounter: Payer: Self-pay | Admitting: Gastroenterology

## 2015-04-01 ENCOUNTER — Ambulatory Visit: Payer: Medicaid Other | Admitting: Physician Assistant

## 2015-04-08 ENCOUNTER — Encounter: Payer: Self-pay | Admitting: Gastroenterology

## 2015-06-03 ENCOUNTER — Ambulatory Visit: Payer: Medicaid Other | Admitting: Gastroenterology

## 2015-06-05 ENCOUNTER — Encounter: Payer: Self-pay | Admitting: Physician Assistant

## 2015-06-05 ENCOUNTER — Ambulatory Visit (INDEPENDENT_AMBULATORY_CARE_PROVIDER_SITE_OTHER): Payer: Self-pay | Admitting: Physician Assistant

## 2015-06-05 VITALS — Ht 64.0 in | Wt 176.0 lb

## 2015-06-05 DIAGNOSIS — R1013 Epigastric pain: Secondary | ICD-10-CM

## 2015-06-05 DIAGNOSIS — R6881 Early satiety: Secondary | ICD-10-CM

## 2015-06-05 DIAGNOSIS — Z1211 Encounter for screening for malignant neoplasm of colon: Secondary | ICD-10-CM

## 2015-06-05 MED ORDER — PANTOPRAZOLE SODIUM 40 MG PO TBEC: 40.0000 mg | DELAYED_RELEASE_TABLET | Freq: Every day | ORAL | Status: DC

## 2015-06-05 NOTE — Patient Instructions (Signed)
You have been scheduled for an endoscopy and colonoscopy. Please follow the written instructions given to you at your visit today. We have given you the prep sample. If you use inhalers (even only as needed), please bring them with you on the day of your procedure. Your physician has requested that you go to www.startemmi.com and enter the access code given to you at your visit today. This web site gives a general overview about your procedure. However, you should still follow specific instructions given to you by our office regarding your preparation for the procedure.

## 2015-06-05 NOTE — Progress Notes (Signed)
Patient ID: Westley Hummer, female   DOB: Jul 12, 1955, 60 y.o.   MRN: [id number]   Subjective:    Patient ID: Westley Hummer, female    DOB: Jun 11, 1955, 60 y.o.   MRN: [id number]  HPI  Immacule is a 60 year old African female , [place] refugee relocated to the [place] within the past year. She's non-English-speaking and history was obtained using a phone interpreter. Review of chart showed the patient had been screened for H. pylori by primary care, this was positive (hpylori screen) and she was treated with a Prevpac and twice a day PPI in August/September 2016. Patient states that she continues to have epigastric discomfort and discomfort up under the left breast and in her lower chest. She states she feels "full" all the time and frequently just eats 1 meal per day. No complaints of dysphagia or odynophagia. No nausea or vomiting. Weight has been stable, appetite has been normal. No changes in bowel habits melena or hematochezia. No prior abdominal surgeries. No prior GI history. Family history negative for colon cancer as far she is aware.  Review of Systems Pertinent positive and negative review of systems were noted in the above HPI section.  All other review of systems was otherwise negative.  Outpatient Encounter Prescriptions as of 06/05/2015  Medication Sig  . acetaminophen (TYLENOL) 650 MG CR tablet Take 1 tablet (650 mg total) by mouth every 8 (eight) hours as needed for pain.  Marland Kitchen amoxicillin-clarithromycin-lansoprazole (PREVPAC) combo pack Take by mouth 2 (two) times daily. Follow package directions.  Marland Kitchen esomeprazole (NEXIUM) 40 MG capsule Take 1 capsule (40 mg total) by mouth daily.  . fluticasone (FLONASE) 50 MCG/ACT nasal spray Place 2 sprays into both nostrils daily.  . pantoprazole (PROTONIX) 40 MG tablet Take 1 tablet (40 mg total) by mouth daily.   No facility-administered encounter medications on file as of 06/05/2015.   No Known Allergies Patient Active Problem  List   Diagnosis Date Noted  . Elevated blood pressure 01/30/2015  . Left shoulder pain 12/25/2014  . Bilateral headaches 11/21/2014  . Preventative health care 10/29/2014  . Epigastric discomfort 10/28/2014  . Goiter 10/28/2014  . Refugee health examination 10/28/2014   Social History   Social History  . Marital Status: Single    Spouse Name: N/A  . Number of Children: 4  . Years of Education: N/A   Occupational History  . Not on file.   Social History Main Topics  . Smoking status: Never Smoker   . Smokeless tobacco: Not on file  . Alcohol Use: No  . Drug Use: No  . Sexual Activity: Not on file   Other Topics Concern  . Not on file   Social History Narrative   Immigrant Social History:   - Name spelling correct?: yes   - Date arrived in [place]: 09/11/14   - Country of origin: [place]   - Location of refugee camp (if applicable), how long there, and what caused patient to leave home country?: was in refugee camp is [place]. Was there a long time. Left because of the war.   - Primary language: kinyarwanda   -Requires intepreter (essentially speaks no [place])   - Education: Highest level of education: no schooling   - Prior work: Psychologist, sport and exercise   - Games developer name and number: [id number]   - Tobacco/alcohol/drug use: no   - Marriage Status: widowed   - Sexual activity: no   - Were you beaten or tortured in your country or refugee camp? no   -  if yes: Are you having bad dreams about your experience?    Do you feel "jumpy" or "nervous?"    Do you feel that the experience is happening again?    Are you "super alert" or watchful?     Ms. Oplinger family history is not on file.      Objective:    There were no vitals filed for this visit.  Physical Exam  well-developed African female in no acute distress, non-English speaking. Height 5 foot 4, weight 176. HEENT;  nontraumatic normocephalic EOMI PERRLA sclerae anicteric, Cardiovascular ;regular rate and rhythm with S1-S2 no murmur or gallop, Pulmonary; clear bilaterally, Abdomen ;soft and tender in the epigastrium and left upper quadrant no guarding or rebound no palpable mass or hepatosplenomegaly bowel sounds present, Rectal ;exam not done, Extremities; no clubbing cyanosis or edema skin warm and dry, Neuropsych; mood and affect appropriate     Assessment & Plan:   #1 60 yo African female,[place] refugee, non English speaking with persistent epigastric discomfort and c/o early satiety, no weight loss. No improvement after Rx for Hpylori Etiology not clear, R/o gastropathy/PUD, gastroparesis, gastric lesion #2 colon neoplasia screening- no prior colon  Plan; Start Protonix 40 mg po am Pt will be scheduled for Colonoscopy and EGD  With Dr Silverio Decamp- procedure discussed in detail with pt and she is agreeable to proceed.  If above unrevealing consider GE scan/ Ctabd   Lucresia Simic S Zayvon Alicea PA-C 06/05/2015   Cc: Alveda Reasons, MD

## 2015-06-08 NOTE — Progress Notes (Signed)
Reviewed and agree with documentation and assessment and plan. K. Veena Rifka Ramey , MD   

## 2015-06-09 ENCOUNTER — Telehealth: Payer: Self-pay | Admitting: Family Medicine

## 2015-06-09 NOTE — Telephone Encounter (Signed)
Family member called because they might need a letter or forms filled out so that the patient can get Medicaid again. They are unsure of what they need from [place] but will call the case worker at [facility] to see what the next steps are. Blima Rich

## 2015-06-10 NOTE — Telephone Encounter (Signed)
Will forward to MD to make aware. Keesha Pellum,CMA  

## 2015-06-11 NOTE — Telephone Encounter (Signed)
Ok.  I will be happy to complete any forms they need.

## 2015-07-10 ENCOUNTER — Encounter: Payer: Self-pay | Admitting: Gastroenterology

## 2015-07-18 LAB — POCT LIPID PANEL
HDL: 49
LDL: 69
TC: 138
TRG: 101

## 2015-07-18 LAB — GLUCOSE, POCT (MANUAL RESULT ENTRY)
POC GLUCOSE: 86 mg/dL (ref 70–99)
POC Glucose: 86 mg/dl (ref 70–99)

## 2015-07-22 ENCOUNTER — Encounter: Payer: Self-pay | Admitting: *Deleted

## 2015-07-22 DIAGNOSIS — Z139 Encounter for screening, unspecified: Secondary | ICD-10-CM

## 2015-07-22 LAB — GLUCOSE, POCT (MANUAL RESULT ENTRY): POC Glucose: 184 mg/dl — AB (ref 70–99)

## 2015-07-22 NOTE — Congregational Nurse Program (Unsigned)
Congregational Nurse Program Note  Date of Encounter: 07/22/2015  Past Medical History: No past medical history on file.  Encounter Details:     CNP Questionnaire - 07/22/15 2028    Patient Demographics   Is this a new or existing patient? New   Patient is considered a/an Refugee   Race African   Patient Assistance   Location of Patient [place]   Patient's financial/insurance status Low Income   Uninsured Patient No   Patient referred to apply for the following financial assistance Not Applicable   Food insecurities addressed Not Applicable   Transportation assistance No   Assistance securing medications No   Educational health offerings Hypertension   Encounter Details   Primary purpose of visit Other   Was an Emergency Department visit averted? Not Applicable   Does patient have a medical provider? Yes   Patient referred to Not Applicable   Was a mental health screening completed? (GAINS tool) No   Does patient have dental issues? No   Does patient have vision issues? Yes   Was a vision referral made? No   Since previous encounter, have you referred patient for abnormal blood pressure that resulted in a new diagnosis or medication change? No   Since previous encounter, have you referred patient for abnormal blood glucose that resulted in a new diagnosis or medication change? No       Client came to the center/request from meeting her at the [place] Saturday March 18 @[employer]. The clients BP was much better and her glucose was 184. Through her interpreter the client states she has an appointment with a Dr. Already and appointment was made by her son. She was very thankful, and feeling much better.

## 2015-09-15 ENCOUNTER — Encounter: Payer: Self-pay | Admitting: Gastroenterology

## 2015-09-15 ENCOUNTER — Ambulatory Visit (AMBULATORY_SURGERY_CENTER): Payer: Medicaid Other | Admitting: Gastroenterology

## 2015-09-15 VITALS — BP 139/79 | HR 75 | Temp 98.2°F | Resp 11 | Ht 64.0 in | Wt 176.0 lb

## 2015-09-15 DIAGNOSIS — K219 Gastro-esophageal reflux disease without esophagitis: Secondary | ICD-10-CM

## 2015-09-15 DIAGNOSIS — Z1211 Encounter for screening for malignant neoplasm of colon: Secondary | ICD-10-CM

## 2015-09-15 DIAGNOSIS — D125 Benign neoplasm of sigmoid colon: Secondary | ICD-10-CM

## 2015-09-15 DIAGNOSIS — R12 Heartburn: Secondary | ICD-10-CM

## 2015-09-15 MED ORDER — PANTOPRAZOLE SODIUM 40 MG PO TBEC: 40.0000 mg | DELAYED_RELEASE_TABLET | Freq: Every day | ORAL | Status: DC

## 2015-09-15 MED ORDER — SODIUM CHLORIDE 0.9 % IV SOLN: 500.0000 mL | INTRAVENOUS | Status: DC

## 2015-09-15 NOTE — Op Note (Signed)
[place] Patient Name: Stephanie Swanson Procedure Date: 09/15/2015 4:27 PM MRN: [id number] Endoscopist: Mauri Pole , MD Age: 60 Referring MD:  Date of Birth: 1955-10-01 Gender: Female Procedure:                Upper GI endoscopy Indications:              Dyspepsia, Indigestion, Heartburn Medicines:                Monitored Anesthesia Care Procedure:                Pre-Anesthesia Assessment:                           - Prior to the procedure, a History and Physical                            was performed, and patient medications and                            allergies were reviewed. The patient's tolerance of                            previous anesthesia was also reviewed. The risks                            and benefits of the procedure and the sedation                            options and risks were discussed with the patient.                            All questions were answered, and informed consent                            was obtained. Prior Anticoagulants: The patient has                            taken no previous anticoagulant or antiplatelet                            agents. ASA Grade Assessment: II - A patient with                            mild systemic disease. After reviewing the risks                            and benefits, the patient was deemed in                            satisfactory condition to undergo the procedure.                           After obtaining informed consent, the endoscope was  passed under direct vision. Throughout the                            procedure, the patient's blood pressure, pulse, and                            oxygen saturations were monitored continuously. The                            Model GIF-[id number]) scope was introduced                            through the mouth, and advanced to the second part                            of duodenum. The upper GI  endoscopy was                            accomplished without difficulty. The patient                            tolerated the procedure well. Scope In: Scope Out: Findings:                 The examined esophagus was normal.                           A medium amount of food (residue) was found in the                            gastric fundus.                           The first portion of the duodenum and second                            portion of the duodenum were normal. Complications:            No immediate complications. Estimated Blood Loss:     Estimated blood loss: none. Impression:               - Normal esophagus.                           - A medium amount of food (residue) in the stomach.                           - Normal first portion of the duodenum and second                            portion of the duodenum.                           - No specimens collected. Recommendation:           - Patient has a contact number available for  emergencies. The signs and symptoms of potential                            delayed complications were discussed with the                            patient. Return to normal activities tomorrow.                            Written discharge instructions were provided to the                            patient.                           - Gastroparesis diet indefinitely.                           - Continue present medications.                           - No repeat upper endoscopy.                           -4 hour gastric emptying scan                           - Return to GI office . Mauri Pole, MD 09/15/2015 5:11:20 PM This report has been signed electronically.

## 2015-09-15 NOTE — Progress Notes (Signed)
Called to room to assist during endoscopic procedure.  Patient ID and intended procedure confirmed with present staff. Received instructions for my participation in the procedure from the performing physician.  

## 2015-09-15 NOTE — Op Note (Signed)
[facility] Patient Name: Stephanie Swanson Procedure Date: 09/15/2015 4:29 PM MRN: [id number] Endoscopist: Mauri Pole , MD Age: 60 Referring MD:  Date of Birth: 1955/10/30 Gender: Female Procedure:                Colonoscopy Indications:              Screening for colorectal malignant neoplasm Medicines:                Monitored Anesthesia Care Procedure:                Pre-Anesthesia Assessment:                           - Prior to the procedure, a History and Physical                            was performed, and patient medications and                            allergies were reviewed. The patient's tolerance of                            previous anesthesia was also reviewed. The risks                            and benefits of the procedure and the sedation                            options and risks were discussed with the patient.                            All questions were answered, and informed consent                            was obtained. Prior Anticoagulants: The patient has                            taken no previous anticoagulant or antiplatelet                            agents. ASA Grade Assessment: II - A patient with                            mild systemic disease. After reviewing the risks                            and benefits, the patient was deemed in                            satisfactory condition to undergo the procedure.                           After obtaining informed consent, the colonoscope  was passed under direct vision. Throughout the                            procedure, the patient's blood pressure, pulse, and                            oxygen saturations were monitored continuously. The                            Model CF-HQ190L [id number]) scope was introduced                            through the anus and advanced to the the cecum,                            identified by appendiceal  orifice and ileocecal                            valve. The colonoscopy was performed without                            difficulty. The patient tolerated the procedure                            well. The quality of the bowel preparation was                            adequate to identify polyps 6 mm and larger in                            size. The ileocecal valve, appendiceal orifice, and                            rectum were photographed. Scope In: 4:49:33 PM Scope Out: 5:02:32 PM Scope Withdrawal Time: 0 hours 9 minutes 46 seconds  Total Procedure Duration: 0 hours 12 minutes 59 seconds  Findings:                 The perianal and digital rectal examinations were                            normal. Fair prep.                           A 6 mm polyp was found in the sigmoid colon. The                            polyp was sessile. The polyp was removed with a                            cold snare. Resection and retrieval were complete. Complications:            No immediate complications. Estimated Blood Loss:     Estimated blood loss was minimal. Impression:               -  One 6 mm polyp in the sigmoid colon, removed with                            a cold snare. Resected and retrieved.                           - The examination was otherwise normal. Recommendation:           - Patient has a contact number available for                            emergencies. The signs and symptoms of potential                            delayed complications were discussed with the                            patient. Return to normal activities tomorrow.                            Written discharge instructions were provided to the                            patient.                           - Gastroparesis diet indefinitely.                           - Continue present medications.                           - Await pathology results.                           - Repeat colonoscopy in 5 years for  surveillance                            given the fair prep.                           - Return to GI clinic PRN.                           - For future colonoscopy the patient will require                            an extended preparation. If there are any                            questions, please contact the gastroenterologist. Mauri Pole, MD 09/15/2015 5:08:38 PM This report has been signed electronically.

## 2015-09-15 NOTE — Progress Notes (Signed)
A/ox3 pleased with MAC, report to Celia RN 

## 2015-09-15 NOTE — Patient Instructions (Signed)
Discharge instructions given. Handout on polyps. Resume previous medications. YOU HAD AN ENDOSCOPIC PROCEDURE TODAY AT THE [facility]:   Refer to the procedure report that was given to you for any specific questions about what was found during the examination.  If the procedure report does not answer your questions, please call your gastroenterologist to clarify.  If you requested that your care partner not be given the details of your procedure findings, then the procedure report has been included in a sealed envelope for you to review at your convenience later.  YOU SHOULD EXPECT: Some feelings of bloating in the abdomen. Passage of more gas than usual.  Walking can help get rid of the air that was put into your GI tract during the procedure and reduce the bloating. If you had a lower endoscopy (such as a colonoscopy or flexible sigmoidoscopy) you may notice spotting of blood in your stool or on the toilet paper. If you underwent a bowel prep for your procedure, you may not have a normal bowel movement for a few days.  Please Note:  You might notice some irritation and congestion in your nose or some drainage.  This is from the oxygen used during your procedure.  There is no need for concern and it should clear up in a day or so.  SYMPTOMS TO REPORT IMMEDIATELY:   Following lower endoscopy (colonoscopy or flexible sigmoidoscopy):  Excessive amounts of blood in the stool  Significant tenderness or worsening of abdominal pains  Swelling of the abdomen that is new, acute  Fever of 100F or higher   Following upper endoscopy (EGD)  Vomiting of blood or coffee ground material  New chest pain or pain under the shoulder blades  Painful or persistently difficult swallowing  New shortness of breath  Fever of 100F or higher  Black, tarry-looking stools  For urgent or emergent issues, a gastroenterologist can be reached at any hour by calling [phone].   DIET: Your first  meal following the procedure should be a small meal and then it is ok to progress to your normal diet. Heavy or fried foods are harder to digest and may make you feel nauseous or bloated.  Likewise, meals heavy in dairy and vegetables can increase bloating.  Drink plenty of fluids but you should avoid alcoholic beverages for 24 hours.  ACTIVITY:  You should plan to take it easy for the rest of today and you should NOT DRIVE or use heavy machinery until tomorrow (because of the sedation medicines used during the test).    FOLLOW UP: Our staff will call the number listed on your records the next business day following your procedure to check on you and address any questions or concerns that you may have regarding the information given to you following your procedure. If we do not reach you, we will leave a message.  However, if you are feeling well and you are not experiencing any problems, there is no need to return our call.  We will assume that you have returned to your regular daily activities without incident.  If any biopsies were taken you will be contacted by phone or by letter within the next 1-3 weeks.  Please call us at [phone] if you have not heard about the biopsies in 3 weeks.    SIGNATURES/CONFIDENTIALITY: You and/or your care partner have signed paperwork which will be entered into your electronic medical record.  These signatures attest to the fact that that the information   above on your After Visit Summary has been reviewed and is understood.  Full responsibility of the confidentiality of this discharge information lies with you and/or your care-partner. 

## 2015-09-16 ENCOUNTER — Telehealth: Payer: Self-pay | Admitting: *Deleted

## 2015-09-16 NOTE — Telephone Encounter (Signed)
  Follow up Call- voice mail box full, unable to leave message.

## 2015-09-23 ENCOUNTER — Encounter: Payer: Self-pay | Admitting: Gastroenterology

## 2015-09-24 ENCOUNTER — Other Ambulatory Visit: Payer: Self-pay

## 2015-09-24 DIAGNOSIS — R1013 Epigastric pain: Secondary | ICD-10-CM

## 2015-09-24 DIAGNOSIS — K3189 Other diseases of stomach and duodenum: Secondary | ICD-10-CM

## 2015-10-13 ENCOUNTER — Encounter (HOSPITAL_COMMUNITY): Admission: RE | Admit: 2015-10-13 | Payer: Medicaid Other | Source: Ambulatory Visit

## 2015-10-20 ENCOUNTER — Encounter (HOSPITAL_COMMUNITY)
Admission: RE | Admit: 2015-10-20 | Discharge: 2015-10-20 | Disposition: A | Discharge status: Home / Self Care | Payer: Medicaid Other | Source: Ambulatory Visit | Attending: Gastroenterology | Admitting: Gastroenterology

## 2015-10-20 DIAGNOSIS — K3189 Other diseases of stomach and duodenum: Secondary | ICD-10-CM

## 2015-10-20 DIAGNOSIS — K30 Functional dyspepsia: Secondary | ICD-10-CM | POA: Insufficient documentation

## 2015-10-20 DIAGNOSIS — R1013 Epigastric pain: Secondary | ICD-10-CM | POA: Diagnosis present

## 2015-10-20 MED ORDER — TECHNETIUM TC 99M SULFUR COLLOID
2.0000 | Freq: Once | INTRAVENOUS | Status: AC | PRN
  Administered 2015-10-20: 2 via INTRAVENOUS

## 2015-12-12 ENCOUNTER — Encounter (HOSPITAL_COMMUNITY): Payer: Self-pay | Admitting: Emergency Medicine

## 2015-12-12 ENCOUNTER — Observation Stay (HOSPITAL_COMMUNITY)
Admission: EM | Admit: 2015-12-12 | Discharge: 2015-12-15 | Disposition: A | Discharge status: Home Health | Payer: Medicaid Other | Attending: Internal Medicine | Admitting: Internal Medicine

## 2015-12-12 ENCOUNTER — Emergency Department (HOSPITAL_COMMUNITY): Payer: Medicaid Other

## 2015-12-12 DIAGNOSIS — R4182 Altered mental status, unspecified: Principal | ICD-10-CM | POA: Insufficient documentation

## 2015-12-12 DIAGNOSIS — R945 Abnormal results of liver function studies: Secondary | ICD-10-CM

## 2015-12-12 DIAGNOSIS — E876 Hypokalemia: Secondary | ICD-10-CM | POA: Insufficient documentation

## 2015-12-12 DIAGNOSIS — R262 Difficulty in walking, not elsewhere classified: Secondary | ICD-10-CM | POA: Insufficient documentation

## 2015-12-12 DIAGNOSIS — R7989 Other specified abnormal findings of blood chemistry: Secondary | ICD-10-CM | POA: Insufficient documentation

## 2015-12-12 DIAGNOSIS — R531 Weakness: Secondary | ICD-10-CM | POA: Insufficient documentation

## 2015-12-12 DIAGNOSIS — I10 Essential (primary) hypertension: Secondary | ICD-10-CM | POA: Insufficient documentation

## 2015-12-12 DIAGNOSIS — R51 Headache: Secondary | ICD-10-CM | POA: Insufficient documentation

## 2015-12-12 DIAGNOSIS — R519 Headache, unspecified: Secondary | ICD-10-CM

## 2015-12-12 DIAGNOSIS — K219 Gastro-esophageal reflux disease without esophagitis: Secondary | ICD-10-CM | POA: Diagnosis not present

## 2015-12-12 HISTORY — DX: Headache: R51

## 2015-12-12 HISTORY — DX: Headache, unspecified: R51.9

## 2015-12-12 LAB — CBC
HEMATOCRIT: 44 % (ref 36.0–46.0)
HEMOGLOBIN: 14.7 g/dL (ref 12.0–15.0)
MCH: 30.8 pg (ref 26.0–34.0)
MCHC: 33.4 g/dL (ref 30.0–36.0)
MCV: 92.1 fL (ref 78.0–100.0)
Platelets: 186 10*3/uL (ref 150–400)
RBC: 4.78 MIL/uL (ref 3.87–5.11)
RDW: 12.9 % (ref 11.5–15.5)
WBC: 6.1 10*3/uL (ref 4.0–10.5)

## 2015-12-12 LAB — DIFFERENTIAL
BASOS ABS: 0 10*3/uL (ref 0.0–0.1)
BASOS PCT: 0 %
Eosinophils Absolute: 0 10*3/uL (ref 0.0–0.7)
Eosinophils Relative: 0 %
Lymphocytes Relative: 21 %
Lymphs Abs: 1.3 10*3/uL (ref 0.7–4.0)
Monocytes Absolute: 0.2 10*3/uL (ref 0.1–1.0)
Monocytes Relative: 4 %
NEUTROS ABS: 4.6 10*3/uL (ref 1.7–7.7)
Neutrophils Relative %: 75 %

## 2015-12-12 LAB — COMPREHENSIVE METABOLIC PANEL
ALBUMIN: 3.2 g/dL — AB (ref 3.5–5.0)
ALK PHOS: 99 U/L (ref 38–126)
ALT: 37 U/L (ref 14–54)
ANION GAP: 10 (ref 5–15)
AST: 52 U/L — ABNORMAL HIGH (ref 15–41)
BUN: 10 mg/dL (ref 6–20)
CALCIUM: 8.5 mg/dL — AB (ref 8.9–10.3)
CO2: 20 mmol/L — AB (ref 22–32)
Chloride: 104 mmol/L (ref 101–111)
Creatinine, Ser: 0.82 mg/dL (ref 0.44–1.00)
GFR calc Af Amer: >60 mL/min (ref >60)
GFR calc non Af Amer: >60 mL/min (ref >60)
GLUCOSE: 103 mg/dL — AB (ref 65–99)
Potassium: 4.2 mmol/L (ref 3.5–5.1)
SODIUM: 134 mmol/L — AB (ref 135–145)
Total Bilirubin: 0.6 mg/dL (ref 0.3–1.2)
Total Protein: 7.4 g/dL (ref 6.5–8.1)

## 2015-12-12 LAB — I-STAT TROPONIN, ED: TROPONIN I, POC: 0.04 ng/mL (ref 0.00–0.08)

## 2015-12-12 LAB — I-STAT CHEM 8, ED
BUN: 10 mg/dL (ref 6–20)
Calcium, Ion: 1.07 mmol/L — ABNORMAL LOW (ref 1.12–1.23)
Chloride: 105 mmol/L (ref 101–111)
Creatinine, Ser: 0.7 mg/dL (ref 0.44–1.00)
Glucose, Bld: 100 mg/dL — ABNORMAL HIGH (ref 65–99)
HEMATOCRIT: 44 % (ref 36.0–46.0)
HEMOGLOBIN: 15 g/dL (ref 12.0–15.0)
Potassium: 4.4 mmol/L (ref 3.5–5.1)
SODIUM: 140 mmol/L (ref 135–145)
TCO2: 22 mmol/L (ref 0–100)

## 2015-12-12 LAB — PROTIME-INR
INR: 1.18
Prothrombin Time: 15 seconds (ref 11.4–15.2)

## 2015-12-12 LAB — APTT: APTT: 36 s (ref 24–36)

## 2015-12-12 LAB — CBG MONITORING, ED: Glucose-Capillary: 95 mg/dL (ref 65–99)

## 2015-12-12 MED ORDER — SODIUM CHLORIDE 0.9 % IV SOLN
INTRAVENOUS | Status: DC
  Administered 2015-12-12: 23:00:00 via INTRAVENOUS

## 2015-12-12 MED ORDER — ENOXAPARIN SODIUM 40 MG/0.4ML ~~LOC~~ SOLN
40.0000 mg | SUBCUTANEOUS | Status: DC
  Administered 2015-12-13 – 2015-12-15 (×3): 40 mg via SUBCUTANEOUS
  Filled 2015-12-12 (×4): qty 0.4

## 2015-12-12 MED ORDER — STROKE: EARLY STAGES OF RECOVERY BOOK
Freq: Once | Status: DC
  Filled 2015-12-12: qty 1

## 2015-12-12 NOTE — H&P (Signed)
History and Physical    Stephanie Swanson [id number] DOB: [birth year] DOA: 12/12/2015   PCP: Annabell Sabal, MD Chief Complaint:  Chief Complaint  Patient presents with  . Altered Mental Status    HPI: Stephanie Swanson is a 60 y.o. female with medical history significant of HTN, GERD, takes some HTN med at home but dosent remember what.  Patient brought to ED by family after they found her with AMS on the couch today, LKW 5pm.  Initially per EMS patient responsive to pain only.  ED Course: Since arrival she appears to have improved some.  Now responding to verbal commands with all 4 extremities (commands given by family members who speak her language), but she still hasnt said anything verbally since onset of symptoms.  Concern for stroke.  Apparently she was following commands with right side but not left earlier in the ED this evening.  Review of Systems: Pt non-verbal, unable to perform   Past Medical History:  Diagnosis Date  . Hypertension     History reviewed. No pertinent surgical history.   reports that she has never smoked. She does not have any smokeless tobacco history on file. She reports that she does not drink alcohol or use drugs.  No Known Allergies  No family history on file. 2 daughters 1 son in [place]   Prior to Admission medications   Medication Sig Start Date End Date Taking? Authorizing Provider  pantoprazole (PROTONIX) 40 MG tablet Take 1 tablet (40 mg total) by mouth daily. 09/15/15  Yes Mauri Pole, MD  acetaminophen (TYLENOL) 650 MG CR tablet Take 1 tablet (650 mg total) by mouth every 8 (eight) hours as needed for pain. Patient not taking: Reported on 09/15/2015 11/21/14   Alveda Reasons, MD  esomeprazole (NEXIUM) 40 MG capsule Take 1 capsule (40 mg total) by mouth daily. Patient not taking: Reported on 09/15/2015 01/30/15   Alveda Reasons, MD  fluticasone [facility]) 50 MCG/ACT nasal spray Place 2 sprays into both nostrils daily. Patient  not taking: Reported on 09/15/2015 01/30/15   Alveda Reasons, MD  pantoprazole (PROTONIX) 40 MG tablet Take 1 tablet (40 mg total) by mouth daily. Patient not taking: Reported on 09/15/2015 06/05/15   Alfredia Ferguson, PA-C    Physical Exam: Vitals:   12/12/15 2150 12/12/15 2154 12/12/15 2200  BP:   102/64  Pulse:   84  Resp:   19  Temp:  97.9 F (36.6 C)   TempSrc:  Axillary   SpO2: 90% 93% 94%  Weight:  79.8 kg (176 lb)   Height:  5\' 5"  (1.651 m)       Constitutional: NAD, calm, comfortable Eyes: PERRL, lids and conjunctivae normal ENMT: Mucous membranes are moist. Posterior pharynx clear of any exudate or lesions.Normal dentition.  Neck: normal, supple, no masses, no thyromegaly Respiratory: clear to auscultation bilaterally, no wheezing, no crackles. Normal respiratory effort. No accessory muscle use.  Cardiovascular: Regular rate and rhythm, no murmurs / rubs / gallops. No extremity edema. 2+ pedal pulses. No carotid bruits.  Abdomen: no tenderness, no masses palpated. No hepatosplenomegaly. Bowel sounds positive.  Musculoskeletal: no clubbing / cyanosis. No joint deformity upper and lower extremities. Good ROM, no contractures. Normal muscle tone.  Skin: no rashes, lesions, ulcers. No induration Neurologic: For me she is following instructions with all 4 extremities.  Still no speech since onset of symptoms per family and not responding verbally. Psychiatric: Unable to assess   Labs on Admission: I have personally reviewed  following labs and imaging studies  CBC:  Recent Labs Lab 12/12/15 2210 12/12/15 2237  WBC 6.1  --   NEUTROABS 4.6  --   HGB 14.7 15.0  HCT 44.0 44.0  MCV 92.1  --   PLT 186  --    Basic Metabolic Panel:  Recent Labs Lab 12/12/15 2210 12/12/15 2237  NA 134* 140  K 4.2 4.4  CL 104 105  CO2 20*  --   GLUCOSE 103* 100*  BUN 10 10  CREATININE 0.82 0.70  CALCIUM 8.5*  --    GFR: Estimated Creatinine Clearance: 78 mL/min (by C-G formula  based on SCr of 0.8 mg/dL). Liver Function Tests:  Recent Labs Lab 12/12/15 2210  AST 52*  ALT 37  ALKPHOS 99  BILITOT 0.6  PROT 7.4  ALBUMIN 3.2*   No results for input(s): LIPASE, AMYLASE in the last 168 hours. No results for input(s): AMMONIA in the last 168 hours. Coagulation Profile:  Recent Labs Lab 12/12/15 2239  INR 1.18   Cardiac Enzymes: No results for input(s): CKTOTAL, CKMB, CKMBINDEX, TROPONINI in the last 168 hours. BNP (last 3 results) No results for input(s): PROBNP in the last 8760 hours. HbA1C: No results for input(s): HGBA1C in the last 72 hours. CBG: No results for input(s): GLUCAP in the last 168 hours. Lipid Profile: No results for input(s): CHOL, HDL, LDLCALC, TRIG, CHOLHDL, LDLDIRECT in the last 72 hours. Thyroid Function Tests: No results for input(s): TSH, T4TOTAL, FREET4, T3FREE, THYROIDAB in the last 72 hours. Anemia Panel: No results for input(s): VITAMINB12, FOLATE, FERRITIN, TIBC, IRON, RETICCTPCT in the last 72 hours. Urine analysis:    Component Value Date/Time   COLORURINE YELLOW 10/15/2014 2111   APPEARANCEUR CLEAR 10/15/2014 2111   LABSPEC 1.012 10/15/2014 2111   PHURINE 6.5 10/15/2014 2111   GLUCOSEU NEGATIVE 10/15/2014 2111   HGBUR NEGATIVE 10/15/2014 2111   BILIRUBINUR NEG 11/21/2014 1002   KETONESUR NEGATIVE 10/15/2014 2111   PROTEINUR NEG 11/21/2014 1002   PROTEINUR NEGATIVE 10/15/2014 2111   UROBILINOGEN 1.0 11/21/2014 1002   UROBILINOGEN 1.0 10/15/2014 2111   NITRITE NEG 11/21/2014 1002   NITRITE NEGATIVE 10/15/2014 2111   LEUKOCYTESUR small (1+) (A) 11/21/2014 1002   Sepsis Labs: @LABRCNTIP (procalcitonin:4,lacticidven:4) )No results found for this or any previous visit (from the past 240 hour(s)).   Radiological Exams on Admission: Ct Head Code Stroke W/o Cm  Result Date: 12/12/2015 CLINICAL DATA:  Code stroke. Patient found unresponsive, with altered mental status. Initial encounter. EXAM: CT HEAD WITHOUT  CONTRAST TECHNIQUE: Contiguous axial images were obtained from the base of the skull through the vertex without intravenous contrast. COMPARISON:  CT of the head performed 10/15/2014 FINDINGS: There is no evidence of acute infarction, mass lesion, or intra- or extra-axial hemorrhage on CT. A likely small chronic lacunar infarct is noted at the left external capsule. The posterior fossa, including the cerebellum, brainstem and fourth ventricle, is within normal limits. The third and lateral ventricles are unremarkable in appearance. The cerebral hemispheres are symmetric in appearance, with normal gray-white differentiation. No mass effect or midline shift is seen. There is no evidence of fracture; visualized osseous structures are unremarkable in appearance. The visualized portions of the orbits are within normal limits. The paranasal sinuses and mastoid air cells are well-aerated. No significant soft tissue abnormalities are seen. ASPECTS [facility] Stroke Program Early CT Score, http://www.aspectsinstroke.com) - Ganglionic level infarction (caudate, lentiform nuclei, internal capsule, insula, M1-M3 cortex): 7 - Supraganglionic infarction (M4-M6 cortex): 3 Total score (  0-10 with 10 being normal): 10 IMPRESSION: 1. No acute intracranial pathology seen on CT. 2. 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 3. ASPECTS score 10 These results were called by telephone at the time of interpretation on 12/12/2015 at 11:02 pm to Dr. Nicole Kindred, who verbally acknowledged these results. Electronically Signed   By: Garald Balding M.D.   On: 12/12/2015 23:03    EKG: Independently reviewed.  Assessment/Plan Active Problems:   Altered mental status    1. AMS - 1. Findings and progression in ED is worrisome for stroke 2. MRI brain ordered to evaluate 1. If positive needs the remainder of stroke work up ordered 3. UA pending 4. IVF 2. HTN - holding BP med for now (dont know what it is anyway, BP  running [id number] systolic anyhow.   DVT prophylaxis: Lovenox Code Status: Full Family Communication: At bedside Consults called: Dr. Nicole Kindred has evaluated in ED Admission status: Admit to obs   Etta Quill DO [facility] Pager [phone] from 7PM-7AM  If 7AM-7PM, please contact the day physician for the patient [email or web address] Password [facility]  12/12/2015, 11:51 PM

## 2015-12-12 NOTE — ED Notes (Signed)
Oxygen applied at 2L per order.  Sat on RA noted to be 86%.

## 2015-12-12 NOTE — ED Provider Notes (Signed)
[facility] DEPT Provider Note   CSN: [id number] Arrival date & time: 12/12/15  2146  First Provider Contact:  None     An emergency department physician performed an initial assessment on this suspected stroke patient at 2226.  History   Chief Complaint Chief Complaint  Patient presents with  . Altered Mental Status    HPI Stephanie Swanson is a 60 y.o. female.  Level V caveat for altered mental status. Patient is from the [facility] and there is a language barrier. History obtained from a family friend who has been the interpreter. Patient was apparently normal at 5 PM. At 8 PM she was found listless and uncommunicative. She has a history of hypertension and is taking her antihypertensive medication. This has never happened before.      Past Medical History:  Diagnosis Date  . Hypertension     Patient Active Problem List   Diagnosis Date Noted  . Elevated blood pressure 01/30/2015  . Left shoulder pain 12/25/2014  . Bilateral headaches 11/21/2014  . Preventative health care 10/29/2014  . Epigastric discomfort 10/28/2014  . Goiter 10/28/2014  . Refugee health examination 10/28/2014    History reviewed. No pertinent surgical history.  OB History    No data available       Home Medications    Prior to Admission medications   Medication Sig Start Date End Date Taking? Authorizing Provider  pantoprazole (PROTONIX) 40 MG tablet Take 1 tablet (40 mg total) by mouth daily. 09/15/15  Yes Mauri Pole, MD  acetaminophen (TYLENOL) 650 MG CR tablet Take 1 tablet (650 mg total) by mouth every 8 (eight) hours as needed for pain. Patient not taking: Reported on 09/15/2015 11/21/14   Alveda Reasons, MD  esomeprazole (NEXIUM) 40 MG capsule Take 1 capsule (40 mg total) by mouth daily. Patient not taking: Reported on 09/15/2015 01/30/15   Alveda Reasons, MD  fluticasone [facility]) 50 MCG/ACT nasal spray Place 2 sprays into both nostrils daily. Patient not taking:  Reported on 09/15/2015 01/30/15   Alveda Reasons, MD  pantoprazole (PROTONIX) 40 MG tablet Take 1 tablet (40 mg total) by mouth daily. Patient not taking: Reported on 09/15/2015 06/05/15   Amy S Esterwood, PA-C    Family History No family history on file.  Social History Social History  Substance Use Topics  . Smoking status: Never Smoker  . Smokeless tobacco: Not on file  . Alcohol use No     Allergies   Review of patient's allergies indicates no known allergies.   Review of Systems Review of Systems  Reason unable to perform ROS: Altered mental status.     Physical Exam Updated Vital Signs BP 102/64   Pulse 84   Temp 97.9 F (36.6 C) (Axillary)   Resp 19   Ht 5\' 5"  (1.651 m)   Wt 176 lb (79.8 kg)   SpO2 94%   BMI 29.29 kg/m   Physical Exam  Constitutional: She appears well-developed and well-nourished. No distress.  Does not vocalize to direct questions.  HENT:  Head: Normocephalic and atraumatic.  Eyes: Conjunctivae are normal.  Neck: Neck supple.  Cardiovascular: Normal rate and regular rhythm.   No murmur heard. Pulmonary/Chest: Effort normal and breath sounds normal. No respiratory distress.  Abdominal: Soft. There is no tenderness.  Musculoskeletal:  Unable  Neurological:  Patient does not respond to direct questioning. Extraocular movements are intact. She appears to move her right arm and leg to command, but not her left arm  and leg.  Skin: Skin is warm and dry.  Psychiatric: She has a normal mood and affect.  Nursing note and vitals reviewed.    ED Treatments / Results  Labs (all labs ordered are listed, but only abnormal results are displayed) Labs Reviewed  COMPREHENSIVE METABOLIC PANEL - Abnormal; Notable for the following:       Result Value   Sodium 134 (*)    CO2 20 (*)    Glucose, Bld 103 (*)    Calcium 8.5 (*)    Albumin 3.2 (*)    AST 52 (*)    All other components within normal limits  I-STAT CHEM 8, ED - Abnormal; Notable for  the following:    Glucose, Bld 100 (*)    Calcium, Ion 1.07 (*)    All other components within normal limits  CBC  PROTIME-INR  APTT  DIFFERENTIAL  I-STAT TROPOININ, ED  CBG MONITORING, ED  CBG MONITORING, ED    EKG  EKG Interpretation  Date/Time:  Saturday December 12 2015 21:52:32 EDT Ventricular Rate:  84 PR Interval:    QRS Duration: 78 QT Interval:  372 QTC Calculation: 440 R Axis:   85 Text Interpretation:  Sinus rhythm Borderline right axis deviation Baseline wander in lead(s) V3 Confirmed by Lacinda Axon  MD, Kess Mcilwain ([id number]) on 12/12/2015 10:09:46 PM       Radiology Ct Head Code Stroke W/o Cm  Result Date: 12/12/2015 CLINICAL DATA:  Code stroke. Patient found unresponsive, with altered mental status. Initial encounter. EXAM: CT HEAD WITHOUT CONTRAST TECHNIQUE: Contiguous axial images were obtained from the base of the skull through the vertex without intravenous contrast. COMPARISON:  CT of the head performed 10/15/2014 FINDINGS: There is no evidence of acute infarction, mass lesion, or intra- or extra-axial hemorrhage on CT. A likely small chronic lacunar infarct is noted at the left external capsule. The posterior fossa, including the cerebellum, brainstem and fourth ventricle, is within normal limits. The third and lateral ventricles are unremarkable in appearance. The cerebral hemispheres are symmetric in appearance, with normal gray-white differentiation. No mass effect or midline shift is seen. There is no evidence of fracture; visualized osseous structures are unremarkable in appearance. The visualized portions of the orbits are within normal limits. The paranasal sinuses and mastoid air cells are well-aerated. No significant soft tissue abnormalities are seen. ASPECTS [facility] Stroke Program Early CT Score, http://www.aspectsinstroke.com) - Ganglionic level infarction (caudate, lentiform nuclei, internal capsule, insula, M1-M3 cortex): 7 - Supraganglionic infarction (M4-M6 cortex):  3 Total score (0-10 with 10 being normal): 10 IMPRESSION: 1. No acute intracranial pathology seen on CT. 2. 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 3. ASPECTS score 10 These results were called by telephone at the time of interpretation on 12/12/2015 at 11:02 pm to Dr. Nicole Kindred, who verbally acknowledged these results. Electronically Signed   By: Garald Balding M.D.   On: 12/12/2015 23:03    Procedures Procedures (including critical care time)  Medications Ordered in ED Medications  0.9 %  sodium chloride infusion ( Intravenous New Bag/Given 12/12/15 2236)     Initial Impression / Assessment and Plan / ED Course  I have reviewed the triage vital signs and the nursing notes.  Pertinent labs & imaging results that were available during my care of the patient were reviewed by me and considered in my medical decision making (see chart for details).  Clinical Course    Code stroke was initiated. Patient seen by Dr. Nicole Kindred. I'm concerned  that she may have had a stroke. Discussed with general medicine. Dr. Alcario Drought will evaluate.  Final Clinical Impressions(s) / ED Diagnoses   Final diagnoses:  Altered mental status, unspecified altered mental status type    New Prescriptions New Prescriptions   No medications on file     Nat Christen, MD 12/12/15 2343

## 2015-12-12 NOTE — ED Triage Notes (Signed)
Brought via EMS from home.  Family reports patient was okay around 5pm and was laying on the couch.  Called EMS because she was hard to arouse.   When EMS arrived found responsive to pain only.  Unable to do NIH because patient would not follow commands.  Language barrier noted.  Remi Haggard (not sure of spelling).  In triage very drowsy.  CBG-[id number].

## 2015-12-13 ENCOUNTER — Observation Stay (HOSPITAL_COMMUNITY): Payer: Medicaid Other

## 2015-12-13 DIAGNOSIS — B1920 Unspecified viral hepatitis C without hepatic coma: Secondary | ICD-10-CM | POA: Diagnosis not present

## 2015-12-13 DIAGNOSIS — R51 Headache: Secondary | ICD-10-CM | POA: Diagnosis not present

## 2015-12-13 DIAGNOSIS — E876 Hypokalemia: Secondary | ICD-10-CM | POA: Diagnosis not present

## 2015-12-13 DIAGNOSIS — R4182 Altered mental status, unspecified: Secondary | ICD-10-CM | POA: Diagnosis not present

## 2015-12-13 DIAGNOSIS — R7989 Other specified abnormal findings of blood chemistry: Secondary | ICD-10-CM | POA: Diagnosis not present

## 2015-12-13 LAB — LIPID PANEL
CHOL/HDL RATIO: 3.3 ratio
CHOLESTEROL: 131 mg/dL (ref 0–200)
HDL: 40 mg/dL — ABNORMAL LOW (ref >40)
LDL CALC: 78 mg/dL (ref 0–99)
Triglycerides: 64 mg/dL (ref <150)
VLDL: 13 mg/dL (ref 0–40)

## 2015-12-13 LAB — URINALYSIS, ROUTINE W REFLEX MICROSCOPIC
BILIRUBIN URINE: NEGATIVE
Glucose, UA: NEGATIVE mg/dL
Hgb urine dipstick: NEGATIVE
KETONES UR: 15 mg/dL — AB
NITRITE: NEGATIVE
PH: 5.5 (ref 5.0–8.0)
PROTEIN: NEGATIVE mg/dL
Specific Gravity, Urine: 1.015 (ref 1.005–1.030)

## 2015-12-13 LAB — RAPID URINE DRUG SCREEN, HOSP PERFORMED
Amphetamines: NOT DETECTED
BARBITURATES: NOT DETECTED
BENZODIAZEPINES: NOT DETECTED
COCAINE: NOT DETECTED
OPIATES: NOT DETECTED
Tetrahydrocannabinol: NOT DETECTED

## 2015-12-13 LAB — TSH: TSH: 0.14 u[IU]/mL — AB (ref 0.350–4.500)

## 2015-12-13 LAB — SEDIMENTATION RATE: Sed Rate: 21 mm/hr (ref 0–22)

## 2015-12-13 LAB — GLUCOSE, CAPILLARY: Glucose-Capillary: 88 mg/dL (ref 65–99)

## 2015-12-13 LAB — URINE MICROSCOPIC-ADD ON
BACTERIA UA: NONE SEEN
RBC / HPF: NONE SEEN RBC/hpf (ref 0–5)
SQUAMOUS EPITHELIAL / LPF: NONE SEEN

## 2015-12-13 LAB — ETHANOL

## 2015-12-13 LAB — T4, FREE: FREE T4: 0.9 ng/dL (ref 0.61–1.12)

## 2015-12-13 LAB — AMMONIA: Ammonia: 43 umol/L — ABNORMAL HIGH (ref 9–35)

## 2015-12-13 MED ORDER — LORAZEPAM 2 MG/ML IJ SOLN
INTRAMUSCULAR | Status: AC
  Filled 2015-12-13: qty 1

## 2015-12-13 MED ORDER — LORAZEPAM 2 MG/ML IJ SOLN
1.0000 mg | Freq: Once | INTRAMUSCULAR | Status: AC
  Administered 2015-12-13: 1 mg via INTRAVENOUS

## 2015-12-13 NOTE — Consult Note (Signed)
Admission H&P    Chief Complaint: Altered mental status.  HPI: Stephanie Swanson is an 60 y.o. female with a history of hypertension and GERD brought to the emergency room after being found unresponsive except to pain. Patient was last known well around 5 PM. She has no history of stroke nor TIA. No clear focal deficits were noted except for reduced movement of left upper extremity compared to the right. Code stroke was activated. CT scan of her head was obtained which was unremarkable. Laboratory studies were unremarkable, including glucose of 103. There were no visible signs of trauma. Mental status improved including level of alertness and speech output. She was able to follow commands when translated into her native language by family members. Code stroke was canceled. Recommended admission for acute mental status change, including MRI of the brain and EEG.  Past Medical History:  Diagnosis Date  . Hypertension     History reviewed. No pertinent surgical history.  Family history: Reviewed and noncontributory  Social History:  reports that she has never smoked. She does not have any smokeless tobacco history on file. She reports that she does not drink alcohol or use drugs.  Allergies: No Known Allergies  Medications: Preadmission medications were reviewed by me.  ROS: Unable to obtain due to patient's confusion.  Physical Examination: Blood pressure 102/64, pulse 84, temperature 97.9 F (36.6 C), temperature source Axillary, resp. rate 19, height 5' 5"  (1.651 m), weight 79.8 kg (176 lb), SpO2 94 %.  HEENT-  Normocephalic, no lesions, without obvious abnormality.  Normal external eye and conjunctiva.  Normal TM's bilaterally.  Normal auditory canals and external ears. Normal external nose, mucus membranes and septum.  Normal pharynx. Neck supple with no masses, nodes, nodules or enlargement. Cardiovascular - regular rate and rhythm, S1, S2 normal, no murmur, click, rub or  gallop Lungs - chest clear, no wheezing, rales, normal symmetric air entry Abdomen - soft, non-tender; bowel sounds normal; no masses,  no organomegaly Extremities - no joint deformities, effusion, or inflammation and no edema  Neurologic Examination: Mental Status: Lethargic, with minimal speech output. Able to follow commands when translated by family members. Cranial Nerves: II-Visual fields were normal. III/IV/VI-Pupils were equal and reacted normally to light. Extraocular movements were full and conjugate.    V/VII-no facial numbness and no facial weakness. VIII-normal. X-equivocal mild dysarthria, commensurate with level of alertness. XI: trapezius strength/neck flexion strength normal bilaterally XII-midline tongue extension with normal strength. Motor: 5/5 bilaterally with normal tone and bulk Sensory: Normal throughout. Deep Tendon Reflexes: 1+ and symmetric in upper extremities and absent in lower extremities. Plantars: Flexor bilaterally Cerebellar: Normal finger-to-nose testing. Carotid auscultation: Normal  Results for orders placed or performed during the hospital encounter of 12/12/15 (from the past 48 hour(s))  Comprehensive metabolic panel     Status: Abnormal   Collection Time: 12/12/15 10:10 PM  Result Value Ref Range   Sodium 134 (L) 135 - 145 mmol/L   Potassium 4.2 3.5 - 5.1 mmol/L   Chloride 104 101 - 111 mmol/L   CO2 20 (L) 22 - 32 mmol/L   Glucose, Bld 103 (H) 65 - 99 mg/dL   BUN 10 6 - 20 mg/dL   Creatinine, Ser 0.82 0.44 - 1.00 mg/dL   Calcium 8.5 (L) 8.9 - 10.3 mg/dL   Total Protein 7.4 6.5 - 8.1 g/dL   Albumin 3.2 (L) 3.5 - 5.0 g/dL   AST 52 (H) 15 - 41 U/L   ALT 37 14 - 54  U/L   Alkaline Phosphatase 99 38 - 126 U/L   Total Bilirubin 0.6 0.3 - 1.2 mg/dL   GFR calc non Af Amer >60 >60 mL/min   GFR calc Af Amer >60 >60 mL/min    Comment: (NOTE) The eGFR has been calculated using the CKD EPI equation. This calculation has not been validated in all  clinical situations. eGFR's persistently <60 mL/min signify possible Chronic Kidney Disease.    Anion gap 10 5 - 15  CBC     Status: None   Collection Time: 12/12/15 10:10 PM  Result Value Ref Range   WBC 6.1 4.0 - 10.5 K/uL   RBC 4.78 3.87 - 5.11 MIL/uL   Hemoglobin 14.7 12.0 - 15.0 g/dL   HCT 44.0 36.0 - 46.0 %   MCV 92.1 78.0 - 100.0 fL   MCH 30.8 26.0 - 34.0 pg   MCHC 33.4 30.0 - 36.0 g/dL   RDW 12.9 11.5 - 15.5 %   Platelets 186 150 - 400 K/uL  Differential     Status: None   Collection Time: 12/12/15 10:10 PM  Result Value Ref Range   Neutrophils Relative % 75 %   Neutro Abs 4.6 1.7 - 7.7 K/uL   Lymphocytes Relative 21 %   Lymphs Abs 1.3 0.7 - 4.0 K/uL   Monocytes Relative 4 %   Monocytes Absolute 0.2 0.1 - 1.0 K/uL   Eosinophils Relative 0 %   Eosinophils Absolute 0.0 0.0 - 0.7 K/uL   Basophils Relative 0 %   Basophils Absolute 0.0 0.0 - 0.1 K/uL  I-stat troponin, ED     Status: None   Collection Time: 12/12/15 10:35 PM  Result Value Ref Range   Troponin i, poc 0.04 0.00 - 0.08 ng/mL   Comment 3            Comment: Due to the release kinetics of cTnI, a negative result within the first hours of the onset of symptoms does not rule out myocardial infarction with certainty. If myocardial infarction is still suspected, repeat the test at appropriate intervals.   I-Stat Chem 8, ED     Status: Abnormal   Collection Time: 12/12/15 10:37 PM  Result Value Ref Range   Sodium 140 135 - 145 mmol/L   Potassium 4.4 3.5 - 5.1 mmol/L   Chloride 105 101 - 111 mmol/L   BUN 10 6 - 20 mg/dL   Creatinine, Ser 0.70 0.44 - 1.00 mg/dL   Glucose, Bld 100 (H) 65 - 99 mg/dL   Calcium, Ion 1.07 (L) 1.12 - 1.23 mmol/L   TCO2 22 0 - 100 mmol/L   Hemoglobin 15.0 12.0 - 15.0 g/dL   HCT 44.0 36.0 - 46.0 %  Protime-INR     Status: None   Collection Time: 12/12/15 10:39 PM  Result Value Ref Range   Prothrombin Time 15.0 11.4 - 15.2 seconds   INR 1.18   APTT     Status: None    Collection Time: 12/12/15 10:39 PM  Result Value Ref Range   aPTT 36 24 - 36 seconds   Ct Head Code Stroke W/o Cm  Result Date: 12/12/2015 CLINICAL DATA:  Code stroke. Patient found unresponsive, with altered mental status. Initial encounter. EXAM: CT HEAD WITHOUT CONTRAST TECHNIQUE: Contiguous axial images were obtained from the base of the skull through the vertex without intravenous contrast. COMPARISON:  CT of the head performed 10/15/2014 FINDINGS: There is no evidence of acute infarction, mass lesion, or intra- or extra-axial  hemorrhage on CT. A likely small chronic lacunar infarct is noted at the left external capsule. The posterior fossa, including the cerebellum, brainstem and fourth ventricle, is within normal limits. The third and lateral ventricles are unremarkable in appearance. The cerebral hemispheres are symmetric in appearance, with normal gray-white differentiation. No mass effect or midline shift is seen. There is no evidence of fracture; visualized osseous structures are unremarkable in appearance. The visualized portions of the orbits are within normal limits. The paranasal sinuses and mastoid air cells are well-aerated. No significant soft tissue abnormalities are seen. ASPECTS [facility] Stroke Program Early CT Score, http://www.aspectsinstroke.com) - Ganglionic level infarction (caudate, lentiform nuclei, internal capsule, insula, M1-M3 cortex): 7 - Supraganglionic infarction (M4-M6 cortex): 3 Total score (0-10 with 10 being normal): 10 IMPRESSION: 1. No acute intracranial pathology seen on CT. 2. 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 3. ASPECTS score 10 These results were called by telephone at the time of interpretation on 12/12/2015 at 11:02 pm to Dr. Nicole Kindred, who verbally acknowledged these results. Electronically Signed   By: Garald Balding M.D.   On: 12/12/2015 23:03    Assessment/Plan 60 year old lady presenting with altered mental status of unclear  etiology. Patient's level of alertness improved after arriving in the emergency room. Onset of mental status changes was not witnessed. Stroke is unlikely but cannot be completely ruled out at this point. She has no clinical signs of stroke, however. Possible seizure with postictal state is also a consideration.  Recommendations: 1. MRI of the brain without contrast 2. Stroke workup including risk assessment if MRI shows acute stroke 3. EEG routine adult study  We will continue to follow this patient with you.  C.R. Nicole Kindred, MD [facility] [phone]  12/13/2015, 12:17 AM

## 2015-12-13 NOTE — ED Notes (Signed)
IV fluid stopped at this time for MRI.

## 2015-12-13 NOTE — ED Notes (Signed)
Attempted report at this time.  Nurse to call back when available. 

## 2015-12-13 NOTE — Progress Notes (Signed)
NURSING PROGRESS NOTE  Stephanie Swanson [id number] Admission Data: 12/13/2015 6:27 AM Attending Provider: Florencia Reasons, MD [id number], MD Code Status: Full  Stephanie Swanson is a 60 y.o. female patient admitted from ED:  -No acute distress noted, however patient is non-verbal at the time of admit.   Cardiac Monitoring: Box # 21 in place. Cardiac monitor yields:normal sinus rhythm.  Blood pressure (!) 116/59, pulse 75, temperature 98.3 F (36.8 C), temperature source Oral, resp. rate 16, height 5\' 5"  (1.651 m), weight 79.8 kg (176 lb), SpO2 100 %.   IV Fluids:  IV in place, occlusive dsg intact without redness, IV cath hand left, condition patent and no redness Finishing 500 ml normal saline bolus that began in the ED.   Allergies:  Review of patient's allergies indicates no known allergies.  Past Medical History:   has a past medical history of Hypertension.  Past Surgical History:   has no past surgical history on file.  Social History:   reports that she has never smoked. She does not have any smokeless tobacco history on file. She reports that she does not drink alcohol or use drugs.  Skin: intact  Patient/Family orientated to room. Information packet given to patient/family. Admission inpatient armband information verified with patient/family to include name and date of birth and placed on patient arm. Side rails up x 2, fall assessment and education completed with patient/family. Patient/family able to verbalize understanding of risk associated with falls and verbalized understanding to call for assistance before getting out of bed. Call light within reach. Patient/family able to voice and demonstrate understanding of unit orientation instructions.    Will continue to evaluate and treat per MD orders.

## 2015-12-13 NOTE — ED Notes (Signed)
Attempted report once again. Nurse to call back when available.

## 2015-12-13 NOTE — ED Notes (Signed)
IV fluid stopped during MRI.  Restarted at this time.

## 2015-12-13 NOTE — Progress Notes (Signed)
Due to patient's nonverbal response and language barrier, the NIH scale was unable to be completed. Daughter and son is at the bedside. Daughter speaks and understands English, however, there are obvious barriers noted when communicating with her.

## 2015-12-13 NOTE — Progress Notes (Signed)
PROGRESS NOTE  Stephanie Swanson [id number] DOB: [birth year] DOA: 12/12/2015 PCP: Annabell Sabal, MD  HPI/Recap of past 24 hours:  More alert this am, start talking, c/o headache ,  oriented to person, not to place or time, states could not remember what had happened  Patient does not speak english, daughter in room help translate Per daughter, patient has chronic daily headache for the last 5years Patient was acting normal until 5pm yesterday afternoon when she started to staring and not talking, she get worse around 8pm when they call the EMS  Assessment/Plan: Active Problems:   Altered mental status    Confusion: MRI brain no stroke. Broad differential including migraine headache variant, seizure, metabolic encephalopathy. Family report patient does not drink alcohol, no illicit drug use or herbal medicine. Denies h/o seizure activities. Will get ua/uds/ammonia/b12/thiamine/tsh/hiv/rpr/esr/crp Still very lethargic, but per family she is more responsive compare to yesterday, Swallow eval pending   Mild elevation of lft: check hepatitis panel/live [place]   Code Status: full  Family Communication: patient , daughter and son in room, daughter speaks english as a Building surveyor requested work note which is provided  Disposition Plan: pending   Consultants:  neurology  Procedures:  EEG  Antibiotics:  none   Objective: BP (!) 116/59 (BP Location: Right Arm)   Pulse 75   Temp 98.3 F (36.8 C) (Oral)   Resp 16   Ht 5' 5"  (1.651 m)   Wt 79.8 kg (176 lb)   SpO2 100%   BMI 29.29 kg/m  No intake or output data in the 24 hours ending 12/13/15 0826 Filed Weights   12/12/15 2154  Weight: 79.8 kg (176 lb)    Exam:   General:  Lethargic, following commands, started to talk to family, only oriented to person  Cardiovascular: RRR  Respiratory: CTABL  Abdomen: Soft/ND/NT, positive BS  Musculoskeletal: No Edema  Neuro: Lethargic, following commands,  started to talk to family, only oriented to person, generalized lower extremity weakness, no focal deficit appreciated  Data Reviewed: Basic Metabolic Panel:  Recent Labs Lab 12/12/15 2210 12/12/15 2237  NA 134* 140  K 4.2 4.4  CL 104 105  CO2 20*  --   GLUCOSE 103* 100*  BUN 10 10  CREATININE 0.82 0.70  CALCIUM 8.5*  --    Liver Function Tests:  Recent Labs Lab 12/12/15 2210  AST 52*  ALT 37  ALKPHOS 99  BILITOT 0.6  PROT 7.4  ALBUMIN 3.2*   No results for input(s): LIPASE, AMYLASE in the last 168 hours. No results for input(s): AMMONIA in the last 168 hours. CBC:  Recent Labs Lab 12/12/15 2210 12/12/15 2237  WBC 6.1  --   NEUTROABS 4.6  --   HGB 14.7 15.0  HCT 44.0 44.0  MCV 92.1  --   PLT 186  --    Cardiac Enzymes:   No results for input(s): CKTOTAL, CKMB, CKMBINDEX, TROPONINI in the last 168 hours. BNP (last 3 results) No results for input(s): BNP in the last 8760 hours.  ProBNP (last 3 results) No results for input(s): PROBNP in the last 8760 hours.  CBG: No results for input(s): GLUCAP in the last 168 hours.  No results found for this or any previous visit (from the past 240 hour(s)).   Studies: Dg Chest 2 View  Result Date: 12/13/2015 CLINICAL DATA:  Altered mental status. EXAM: CHEST  2 VIEW COMPARISON:  None. FINDINGS: Poor inspiration. Grossly normal sized heart. Clear lungs. Mildly prominent interstitial markings.  Mild scoliosis. IMPRESSION: No acute abnormality.  Mild chronic interstitial lung disease. Electronically Signed   By: Claudie Revering M.D.   On: 12/13/2015 02:23   Mr Brain Wo Contrast  Result Date: 12/13/2015 CLINICAL DATA:  Initial valuation for acute altered mental status. EXAM: MRI HEAD WITHOUT CONTRAST TECHNIQUE: Multiplanar, multiecho pulse sequences of the brain and surrounding structures were obtained without intravenous contrast. COMPARISON:  Prior CT from 12/12/2015. FINDINGS: Age appropriate cerebral atrophy present. No  significant cerebral white matter disease. No abnormal foci of restricted diffusion to suggest acute or subacute ischemia. Gray-white matter differentiation well maintained. Major intracranial vascular flow voids are preserved. No acute or chronic intracranial hemorrhage. No areas of chronic infarction. No mass lesion, midline shift, or mass effect. No hydrocephalus. No extra-axial fluid collection. Major dural sinuses are grossly patent. Craniocervical junction normal. Visualized upper cervical spine unremarkable. Pituitary gland within normal limits. No acute abnormality about the globes and orbits. Mild mucosal thickening within the ethmoidal air cells and maxillary sinuses. No mastoid effusion. Inner ear structures grossly normal. Bone marrow signal intensity within normal limits. Hyperostosis frontalis interna noted. No scalp soft tissue abnormality. IMPRESSION: Normal brain MRI.  No acute intracranial process identified. Electronically Signed   By: Jeannine Boga M.D.   On: 12/13/2015 02:31   Ct Head Code Stroke W/o Cm  Result Date: 12/12/2015 CLINICAL DATA:  Code stroke. Patient found unresponsive, with altered mental status. Initial encounter. EXAM: CT HEAD WITHOUT CONTRAST TECHNIQUE: Contiguous axial images were obtained from the base of the skull through the vertex without intravenous contrast. COMPARISON:  CT of the head performed 10/15/2014 FINDINGS: There is no evidence of acute infarction, mass lesion, or intra- or extra-axial hemorrhage on CT. A likely small chronic lacunar infarct is noted at the left external capsule. The posterior fossa, including the cerebellum, brainstem and fourth ventricle, is within normal limits. The third and lateral ventricles are unremarkable in appearance. The cerebral hemispheres are symmetric in appearance, with normal gray-white differentiation. No mass effect or midline shift is seen. There is no evidence of fracture; visualized osseous structures are  unremarkable in appearance. The visualized portions of the orbits are within normal limits. The paranasal sinuses and mastoid air cells are well-aerated. No significant soft tissue abnormalities are seen. ASPECTS [facility] Stroke Program Early CT Score, http://www.aspectsinstroke.com) - Ganglionic level infarction (caudate, lentiform nuclei, internal capsule, insula, M1-M3 cortex): 7 - Supraganglionic infarction (M4-M6 cortex): 3 Total score (0-10 with 10 being normal): 10 IMPRESSION: 1. No acute intracranial pathology seen on CT. 2. 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 3. ASPECTS score 10 These results were called by telephone at the time of interpretation on 12/12/2015 at 11:02 pm to Dr. Nicole Kindred, who verbally acknowledged these results. Electronically Signed   By: Garald Balding M.D.   On: 12/12/2015 23:03    Scheduled Meds: .  stroke: mapping our early stages of recovery book   Does not apply Once  . enoxaparin (LOVENOX) injection  40 mg Subcutaneous Q24H  . LORazepam        Continuous Infusions: . sodium chloride 500 mL/hr at 12/13/15 0240     Time spent: 2mns, , patient does not speak english, needs daughter to translate  From 11:25 to 133 Bassheva Flury MD, PhD  [facility] Pager [phone] If 7PM-7AM, please contact night-coverage at [email or web address], password [email or web address]8/13/2017, 8:26 AM  LOS: 0 days

## 2015-12-14 ENCOUNTER — Observation Stay (HOSPITAL_COMMUNITY): Payer: Medicaid Other

## 2015-12-14 DIAGNOSIS — E876 Hypokalemia: Secondary | ICD-10-CM | POA: Diagnosis not present

## 2015-12-14 DIAGNOSIS — R7989 Other specified abnormal findings of blood chemistry: Secondary | ICD-10-CM

## 2015-12-14 DIAGNOSIS — B1920 Unspecified viral hepatitis C without hepatic coma: Secondary | ICD-10-CM | POA: Diagnosis not present

## 2015-12-14 DIAGNOSIS — R4182 Altered mental status, unspecified: Secondary | ICD-10-CM

## 2015-12-14 LAB — HEMOGLOBIN A1C
HEMOGLOBIN A1C: 5.4 % (ref 4.8–5.6)
Mean Plasma Glucose: 108 mg/dL

## 2015-12-14 LAB — D-DIMER, QUANTITATIVE: D-Dimer, Quant: 0.54 ug/mL-FEU — ABNORMAL HIGH (ref 0.00–0.50)

## 2015-12-14 LAB — COMPREHENSIVE METABOLIC PANEL
ALT: 34 U/L (ref 14–54)
AST: 48 U/L — ABNORMAL HIGH (ref 15–41)
Albumin: 2.9 g/dL — ABNORMAL LOW (ref 3.5–5.0)
Alkaline Phosphatase: 91 U/L (ref 38–126)
Anion gap: 7 (ref 5–15)
BILIRUBIN TOTAL: 1 mg/dL (ref 0.3–1.2)
BUN: 11 mg/dL (ref 6–20)
CHLORIDE: 106 mmol/L (ref 101–111)
CO2: 25 mmol/L (ref 22–32)
CREATININE: 0.69 mg/dL (ref 0.44–1.00)
Calcium: 8.6 mg/dL — ABNORMAL LOW (ref 8.9–10.3)
Glucose, Bld: 80 mg/dL (ref 65–99)
POTASSIUM: 3.2 mmol/L — AB (ref 3.5–5.1)
Sodium: 138 mmol/L (ref 135–145)
TOTAL PROTEIN: 7.2 g/dL (ref 6.5–8.1)

## 2015-12-14 LAB — HEPATITIS PANEL, ACUTE
HCV Ab: >11 s/co ratio — ABNORMAL HIGH (ref 0.0–0.9)
HEP A IGM: NEGATIVE
HEP B C IGM: NEGATIVE
Hepatitis B Surface Ag: NEGATIVE

## 2015-12-14 LAB — CBC
HCT: 41.9 % (ref 36.0–46.0)
Hemoglobin: 13.8 g/dL (ref 12.0–15.0)
MCH: 30.3 pg (ref 26.0–34.0)
MCHC: 32.9 g/dL (ref 30.0–36.0)
MCV: 91.9 fL (ref 78.0–100.0)
PLATELETS: 183 10*3/uL (ref 150–400)
RBC: 4.56 MIL/uL (ref 3.87–5.11)
RDW: 13.1 % (ref 11.5–15.5)
WBC: 7.3 10*3/uL (ref 4.0–10.5)

## 2015-12-14 LAB — MAGNESIUM: MAGNESIUM: 2.1 mg/dL (ref 1.7–2.4)

## 2015-12-14 LAB — RPR: RPR Ser Ql: NONREACTIVE

## 2015-12-14 LAB — T3, FREE: T3 FREE: 3 pg/mL (ref 2.0–4.4)

## 2015-12-14 LAB — VITAMIN B12: VITAMIN B 12: 307 pg/mL (ref 180–914)

## 2015-12-14 MED ORDER — POTASSIUM CHLORIDE 10 MEQ/100ML IV SOLN
10.0000 meq | INTRAVENOUS | Status: DC
  Administered 2015-12-14: 10 meq via INTRAVENOUS
  Filled 2015-12-14: qty 100

## 2015-12-14 MED ORDER — ACETAMINOPHEN 325 MG PO TABS
650.0000 mg | ORAL_TABLET | Freq: Four times a day (QID) | ORAL | Status: DC | PRN
  Administered 2015-12-15: 650 mg via ORAL
  Filled 2015-12-14: qty 2

## 2015-12-14 MED ORDER — FLUTICASONE PROPIONATE 50 MCG/ACT NA SUSP
2.0000 | Freq: Every day | NASAL | Status: DC
  Administered 2015-12-14 – 2015-12-15 (×2): 2 via NASAL
  Filled 2015-12-14: qty 16

## 2015-12-14 MED ORDER — LACTULOSE 10 GM/15ML PO SOLN
30.0000 g | Freq: Two times a day (BID) | ORAL | Status: DC
  Administered 2015-12-14 – 2015-12-15 (×3): 30 g via ORAL
  Filled 2015-12-14 (×2): qty 45

## 2015-12-14 MED ORDER — POTASSIUM CHLORIDE CRYS ER 20 MEQ PO TBCR
40.0000 meq | EXTENDED_RELEASE_TABLET | Freq: Once | ORAL | Status: AC
  Administered 2015-12-14: 40 meq via ORAL
  Filled 2015-12-14: qty 2

## 2015-12-14 MED ORDER — SODIUM CHLORIDE 0.9 % IV SOLN
INTRAVENOUS | Status: AC
  Administered 2015-12-14: 10:00:00 via INTRAVENOUS

## 2015-12-14 NOTE — Progress Notes (Addendum)
PT Cancellation Note  Patient Details Name: Stephanie Swanson MRN: [id number] DOB: [birth year]   Cancelled Treatment:    Reason Eval/Treat Not Completed: Patient at procedure or test/unavailable. Patient out of room for EEG. Per RN, will be out of room ~1.5 hours. Will attempt to see pt early 8/15   Kieran Nachtigal 12/14/2015, 3:01 PM  Pager [phone]

## 2015-12-14 NOTE — Care Management Note (Signed)
Case Management Note  Patient Details  Name: Stephanie Swanson MRN: [id number] Date of Birth: 03-04-1956  Subjective/Objective:             Patient in obs for AMS.Rule out migraine vs seizure. Pending EEG, SLP PT consults, Neuro team consulting.       Action/Plan:  CM will continue to follow for DC planning Expected Discharge Date:                  Expected Discharge Plan:  Home/Self Care  In-House Referral:     Discharge planning Services  CM Consult  Post Acute Care Choice:    Choice offered to:     DME Arranged:    DME Agency:     HH Arranged:    HH Agency:     Status of Service:  In process, will continue to follow  If discussed at Long Length of Stay Meetings, dates discussed:    Additional Comments:  Carles Collet, RN 12/14/2015, 1:25 PM

## 2015-12-14 NOTE — Progress Notes (Signed)
Unable to complete NIH Stroke scale due to language barrier and patient's nonverbal status. Family in the room with limited understanding of English. Will continue to monitor the patient closely.

## 2015-12-14 NOTE — Evaluation (Signed)
Clinical/Bedside Swallow Evaluation Patient Details  Name: Stephanie Swanson MRN: [id number] Date of Birth: [birth year]  Today's Date: 12/14/2015 Time: SLP Start Time (ACUTE ONLY): 1050 SLP Stop Time (ACUTE ONLY): 1103 SLP Time Calculation (min) (ACUTE ONLY): 13 min  Past Medical History:  Past Medical History:  Diagnosis Date  . Hypertension    Past Surgical History: History reviewed. No pertinent surgical history. HPI:  60 y/o f admitted 12/12/15 with AMS. MRI negative. Alertness and movement have gradually improved.    Assessment / Plan / Recommendation Clinical Impression  Pt referred for swallow assessment to determine function and most appropriate diet consistency. Pt tolerated all textures WFL. Of note, fullness of the anterior throat noted upon palpation which limited palpation of the hyoid movement. Recommend: Regular diet with thin liquids. Meds whole with thin. Will follow x 1.     Aspiration Risk  No limitations    Diet Recommendation Regular;Thin liquid   Liquid Administration via: Cup;Straw Medication Administration: Whole meds with liquid Supervision: Patient able to self feed Postural Changes: Seated upright at 90 degrees    Other  Recommendations Oral Care Recommendations: Oral care QID   Follow up Recommendations  None    Frequency and Duration min 1 x/week  1 week       Prognosis Prognosis for Safe Diet Advancement: Good      Swallow Study   General Date of Onset: 12/12/15 HPI: 60 y/o f admitted 12/12/15 with AMS. MRI negative. Alertness and movement have gradually improved.  Type of Study: Bedside Swallow Evaluation Previous Swallow Assessment: none Diet Prior to this Study: NPO Temperature Spikes Noted: No Respiratory Status: Room air History of Recent Intubation: No Behavior/Cognition: Alert;Cooperative;Pleasant mood Oral Cavity Assessment: Within Functional Limits Oral Care Completed by SLP: No Oral Cavity - Dentition: Adequate natural  dentition Vision: Functional for self-feeding Self-Feeding Abilities: Able to feed self Patient Positioning: Upright in bed Baseline Vocal Quality: Normal Volitional Cough: Strong Volitional Swallow: Able to elicit    Oral/Motor/Sensory Function Overall Oral Motor/Sensory Function: Within functional limits   Ice Chips Ice chips: Impaired Presentation: Spoon Pharyngeal Phase Impairments:  (pt gagged, because she thought it was med, does not use ice)   Thin Liquid Thin Liquid: Within functional limits Presentation: Cup;Self Fed    Nectar Thick Nectar Thick Liquid: Not tested   Honey Thick Honey Thick Liquid: Not tested   Puree Puree: Within functional limits Presentation: Self Fed;Spoon   Solid   GO   Solid: Within functional limits Presentation: Self Fed    Functional Assessment Tool Used: clinician judgement Functional Limitations: Swallowing Swallow Current Status [id number]): 0 percent impaired, limited or restricted Swallow Goal Status [id number]): 0 percent impaired, limited or restricted   Vinetta Bergamo MA, CCC-SLP Pager [phone] 12/14/2015,11:20 AM

## 2015-12-14 NOTE — Progress Notes (Signed)
PROGRESS NOTE  Stephanie Swanson TKW:[id number] DOB: Jul 14, 1955 DOA: 12/12/2015 PCP: Stephanie Sabal, MD  HPI/Recap of past 24 hours:  Fully alert this am, start talking, c/o bilateral frontal headache , chronic nasal congestion oriented to person, know she is in the hospital, not oriented to time states could not remember what had happened  Patient does not speak english, daughter in room help translate  Per daughter, patient has chronic daily headache for the last 5years Patient was acting normal until 5pm yesterday afternoon when she started to staring and not talking, she get worse around 8pm when they call the EMS  Assessment/Plan: Active Problems:   Altered mental status    Confusion:  MRI brain no stroke. Broad differential including migraine headache variant, seizure, metabolic encephalopathy.  Family report patient does not drink alcohol, no illicit drug use or herbal medicine. Denies h/o seizure activities.  ua unremarkable/uds negative/ammonia mildly elevated /b12 wnl /thiamine pending /tsh wnl /hiv pending /rpr negative /esr wnl Fully alert today, She passed swallow eval, started on regular diet She is not oriented to time, eeg pending, neurology following    Mild elevation of lft: + hepc, will check viral load/liver US  Elevated ammonia level, trial of lactulose  Hypokalemia: replace k  Weakness: PT   Code Status: full  Family Communication: patient , daughter and son in room, daughter speaks english as a Optometrist Daughter requested work note which is provided  Disposition Plan: likely home on 8/15 with pmd/ID/neurology follow up   Consultants:  neurology  Procedures:  EEG  Antibiotics:  none   Objective: BP 118/65 (BP Location: Right Arm)   Pulse 89   Temp 98.5 F (36.9 C) (Oral)   Resp 20   Ht 5' 5"  (1.651 m)   Wt 79.8 kg (176 lb)   SpO2 97%   BMI 29.29 kg/m   Intake/Output Summary (Last 24 hours) at 12/14/15 1325 Last data  filed at 12/14/15 0400  Gross per 24 hour  Intake             1300 ml  Output                0 ml  Net             1300 ml   Filed Weights   12/12/15 2154  Weight: 79.8 kg (176 lb)    Exam:   General:  Fully alert, following commands, smiling and  talking to family, not oriented to time, but to person and know she is in the hospital  Cardiovascular: RRR  Respiratory: CTABL  Abdomen: Soft/ND/NT, positive BS  Musculoskeletal: No Edema  Neuro: Fully alert, following commands, smiling and  talking to family, not oriented to time, but to person and know she is in the hospital, no focal deficit appreciated  Data Reviewed: Basic Metabolic Panel:  Recent Labs Lab 12/12/15 2210 12/12/15 2237 12/14/15 0623  NA 134* 140 138  K 4.2 4.4 3.2*  CL 104 105 106  CO2 20*  --  25  GLUCOSE 103* 100* 80  BUN 10 10 11   CREATININE 0.82 0.70 0.69  CALCIUM 8.5*  --  8.6*  MG  --   --  2.1   Liver Function Tests:  Recent Labs Lab 12/12/15 2210 12/14/15 0623  AST 52* 48*  ALT 37 34  ALKPHOS 99 91  BILITOT 0.6 1.0  PROT 7.4 7.2  ALBUMIN 3.2* 2.9*   No results for input(s): LIPASE, AMYLASE in the last 168 hours.  Recent Labs Lab 12/13/15 0914  AMMONIA 43*   CBC:  Recent Labs Lab 12/12/15 2210 12/12/15 2237 12/14/15 0623  WBC 6.1  --  7.3  NEUTROABS 4.6  --   --   HGB 14.7 15.0 13.8  HCT 44.0 44.0 41.9  MCV 92.1  --  91.9  PLT 186  --  183   Cardiac Enzymes:   No results for input(s): CKTOTAL, CKMB, CKMBINDEX, TROPONINI in the last 168 hours. BNP (last 3 results) No results for input(s): BNP in the last 8760 hours.  ProBNP (last 3 results) No results for input(s): PROBNP in the last 8760 hours.  CBG:  Recent Labs Lab 12/13/15 2007  GLUCAP 88    Recent Results (from the past 240 hour(s))  Urine culture     Status: None (Preliminary result)   Collection Time: 12/13/15  9:13 AM  Result Value Ref Range Status   Specimen Description URINE, CATHETERIZED   Final   Special Requests NONE  Final   Culture CULTURE REINCUBATED FOR BETTER GROWTH  Final   Report Status PENDING  Incomplete     Studies: US Abdomen Limited Ruq  Result Date: 12/14/2015 CLINICAL DATA:  Elevated LFTs. EXAM: US ABDOMEN LIMITED - RIGHT UPPER QUADRANT COMPARISON:  12/05/2014 FINDINGS: Gallbladder: Echogenic material in the gallbladder appears to represent stones. Largest stone roughly measures 0.3 cm. Gallbladder is not distended. Reportedly, the patient does not have a sonographic Murphy's sign. Gallbladder wall measures 0.2 cm. Common bile duct: Diameter: 0.4 cm. Liver: Liver parenchyma is mildly heterogeneous. Liver contour is mildly nodular along the posterior aspect. No focal liver lesion. IMPRESSION: Cholelithiasis.  No biliary dilatation. Liver is mildly heterogeneous. Difficult to exclude liver nodularity. Cirrhotic changes cannot be excluded based on the sonographic appearance. Electronically Signed   By: Markus Daft M.D.   On: 12/14/2015 09:49    Scheduled Meds: .  stroke: mapping our early stages of recovery book   Does not apply Once  . enoxaparin (LOVENOX) injection  40 mg Subcutaneous Q24H  . fluticasone  2 spray Each Nare Daily  . lactulose  30 g Oral BID    Continuous Infusions: . sodium chloride 75 mL/hr at 12/14/15 1771     Time spent: 96mns, , patient does not speak english, needs daughter to translate    [name] MD, PhD  [facility] Pager [phone] If 7PM-7AM, please contact night-coverage at [email or web address], password [facility]8/14/2017, 1:25 PM  LOS: 0 days

## 2015-12-14 NOTE — Progress Notes (Signed)
EEG completed, results pending. 

## 2015-12-14 NOTE — Procedures (Signed)
ELECTROENCEPHALOGRAM REPORT  Date of Study: 12/14/2015  Patient's Name: Madeira Ferns MRN: [id number] Date of Birth: May 11, 1955  Referring Provider: Dr. Florencia Reasons  Clinical History: This is a 60 year old woman found unresponsive.   Medications: enoxaparin (LOVENOX) injection 40 mg  fluticasone (FLONASE) 50 MCG/ACT nasal spray 2 spray  lactulose (CHRONULAC) 10 GM/15ML solution 30 g   Technical Summary: A multichannel digital EEG recording measured by the international 10-20 system with electrodes applied with paste and impedances below 5000 ohms performed as portable with EKG monitoring in an awake patient.  Hyperventilation and photic stimulation were not performed.  The digital EEG was referentially recorded, reformatted, and digitally filtered in a variety of bipolar and referential montages for optimal display.   Description: The patient is awake during the recording.  During maximal wakefulness, there is a poorly sustained 8 Hz posterior dominant rhythm that poorly attenuates to eye opening and eye closure. This is admixed with a occasional diffuse 4-5 Hz theta and 2-3 Hz delta slowing, as well as independent 4-5 Hz theta slowing over the bilateral temporal regions, left greater than right. Normal sleep architecture is not seen. Hyperventilation and photic stimulation were not performed.  There were no epileptiform discharges or electrographic seizures seen.    EKG lead was unremarkable.  Impression: This awake EEG is abnormal due to the presence of: 1. Mild diffuse slowing of the waking background 2. Independent focal slowing over the bilateral temporal region, left greater than right  Clinical Correlation of the above findings indicates diffuse cerebral dysfunction that is non-specific in etiology and can be seen with hypoxic/ischemic injury, toxic/metabolic encephalopathies,or medication effect. Focal slowing over the bilateral temporal regions indicates focal cerebral  dysfunction suggestive of underlying structural or physiologic abnormality. The absence of epileptiform discharges does not rule out a clinical diagnosis of epilepsy.  Clinical correlation is advised.   Ellouise Newer, M.D.

## 2015-12-15 ENCOUNTER — Encounter (HOSPITAL_COMMUNITY): Payer: Self-pay | Admitting: General Practice

## 2015-12-15 DIAGNOSIS — R7989 Other specified abnormal findings of blood chemistry: Secondary | ICD-10-CM | POA: Diagnosis not present

## 2015-12-15 DIAGNOSIS — R4182 Altered mental status, unspecified: Secondary | ICD-10-CM | POA: Diagnosis not present

## 2015-12-15 DIAGNOSIS — B1920 Unspecified viral hepatitis C without hepatic coma: Secondary | ICD-10-CM | POA: Diagnosis not present

## 2015-12-15 DIAGNOSIS — R51 Headache: Secondary | ICD-10-CM | POA: Diagnosis not present

## 2015-12-15 DIAGNOSIS — E876 Hypokalemia: Secondary | ICD-10-CM | POA: Diagnosis not present

## 2015-12-15 LAB — COMPREHENSIVE METABOLIC PANEL
ALBUMIN: 3.1 g/dL — AB (ref 3.5–5.0)
ALT: 38 U/L (ref 14–54)
AST: 50 U/L — ABNORMAL HIGH (ref 15–41)
Alkaline Phosphatase: 89 U/L (ref 38–126)
Anion gap: 9 (ref 5–15)
BUN: <5 mg/dL — ABNORMAL LOW (ref 6–20)
CHLORIDE: 107 mmol/L (ref 101–111)
CO2: 23 mmol/L (ref 22–32)
CREATININE: 0.58 mg/dL (ref 0.44–1.00)
Calcium: 8.5 mg/dL — ABNORMAL LOW (ref 8.9–10.3)
GFR calc non Af Amer: >60 mL/min (ref >60)
Glucose, Bld: 91 mg/dL (ref 65–99)
Potassium: 3.3 mmol/L — ABNORMAL LOW (ref 3.5–5.1)
SODIUM: 139 mmol/L (ref 135–145)
Total Bilirubin: 1.1 mg/dL (ref 0.3–1.2)
Total Protein: 8 g/dL (ref 6.5–8.1)

## 2015-12-15 LAB — HIV ANTIBODY (ROUTINE TESTING W REFLEX): HIV Screen 4th Generation wRfx: NONREACTIVE

## 2015-12-15 LAB — AMMONIA: Ammonia: 36 umol/L — ABNORMAL HIGH (ref 9–35)

## 2015-12-15 LAB — URINE CULTURE: Culture: 30000 — AB

## 2015-12-15 MED ORDER — POTASSIUM CHLORIDE CRYS ER 20 MEQ PO TBCR
40.0000 meq | EXTENDED_RELEASE_TABLET | Freq: Once | ORAL | Status: AC
  Administered 2015-12-15: 40 meq via ORAL
  Filled 2015-12-15: qty 2

## 2015-12-15 MED ORDER — AMITRIPTYLINE HCL 10 MG PO TABS: 10.0000 mg | ORAL_TABLET | Freq: Every day | ORAL | 0 refills | Status: DC

## 2015-12-15 MED ORDER — TRAMADOL HCL 50 MG PO TABS
50.0000 mg | ORAL_TABLET | Freq: Once | ORAL | Status: AC
  Administered 2015-12-15: 50 mg via ORAL
  Filled 2015-12-15: qty 1

## 2015-12-15 MED ORDER — LACTULOSE 10 GM/15ML PO SOLN: 30.0000 g | Freq: Two times a day (BID) | ORAL | 0 refills | Status: DC

## 2015-12-15 MED ORDER — AMOXICILLIN 500 MG PO CAPS
500.0000 mg | ORAL_CAPSULE | Freq: Three times a day (TID) | ORAL | Status: DC
  Administered 2015-12-15: 500 mg via ORAL
  Filled 2015-12-15 (×2): qty 1

## 2015-12-15 MED ORDER — AMOXICILLIN 500 MG PO CAPS: 500.0000 mg | ORAL_CAPSULE | Freq: Three times a day (TID) | ORAL | 0 refills | Status: DC

## 2015-12-15 MED ORDER — FLUTICASONE PROPIONATE 50 MCG/ACT NA SUSP: 2.0000 | Freq: Every day | NASAL | 2 refills | Status: DC

## 2015-12-15 NOTE — Progress Notes (Signed)
Speech Language Pathology Treatment: Dysphagia  Patient Details Name: Stephanie Swanson MRN: [id number] DOB: 03/25/1956 Today's Date: 12/15/2015 Time: 4417-1278 SLP Time Calculation (min) (ACUTE ONLY): 9 min  Assessment / Plan / Recommendation Clinical Impression  Pt seen for dysphagia followup. Pt observed with regular textures and thin via cup. Pt able to self-manage all PO without difficulty. No s/s aspiration or dysphagia. Pt denies any further gagging episodes. No further SLP involvement indicated at this time, will sign off.    HPI HPI: 60 y/o f admitted 12/12/15 with AMS. MRI negative. Alertness and movement have gradually improved.       SLP Plan  All goals met     Recommendations  Diet recommendations: Regular;Thin liquid Liquids provided via: Cup Medication Administration: Whole meds with liquid Supervision: Patient able to self feed Postural Changes and/or Swallow Maneuvers: Seated upright 90 degrees             Oral Care Recommendations: Oral care QID Follow up Recommendations: None Plan: All goals met     GO           Functional Assessment Tool Used: clinician judgement Functional Limitations: Swallowing Swallow Goal Status (N1836): 0 percent impaired, limited or restricted Swallow Discharge Status (D2550): 0 percent impaired, limited or restricted    Vinetta Bergamo MA, CCC-SLP 12/15/2015, 9:08 AM

## 2015-12-15 NOTE — Evaluation (Signed)
Physical Therapy Evaluation Patient Details Name: Stephanie Swanson MRN: [id number] DOB: Aug 31, 1955 Today's Date: 12/15/2015   History of Present Illness  Stephanie Swanson a 60 y.o.femalewith medical history significant of HTN, GERD. Pt brough to ED after they found  her with AMS on the couch today. She was non-verbal.  Clinical Impression  Pt admitted with above diagnosis. Pt near baseline. Pt verbal and moving all extremities. Pt mildly unsteady with gait but suspect this to be due to prolonged bed rest. Acute PT to con't to follow.       Follow Up Recommendations Home health PT;Supervision/Assistance - 24 hour (however has medicaid so unsure if pt can receive home health)    Equipment Recommendations  None recommended by PT    Recommendations for Other Services       Precautions / Restrictions Precautions Precautions: Fall Restrictions Weight Bearing Restrictions: No      Mobility  Bed Mobility Overal bed mobility: Modified Independent                Transfers Overall transfer level: Needs assistance Equipment used: None Transfers: Sit to/from Stand Sit to Stand: Supervision         General transfer comment: pt slightly impulsive but no episodes of LOB  Ambulation/Gait Ambulation/Gait assistance: Min guard Ambulation Distance (Feet): 200 Feet Assistive device: None Gait Pattern/deviations: Step-through pattern;Staggering left;Wide base of support   Gait velocity interpretation: at or above normal speed for age/gender General Gait Details: pt mildly impulsive with vearing to the left, occasional step over gait pattern but able to maintain balance   Stairs Stairs: Yes Stairs assistance: Min guard Stair Management: One rail Left Number of Stairs: 3 (limited by IV pole) General stair comments: v/c's to slow down, no episodes of instability  Wheelchair Mobility    Modified Rankin (Stroke Patients Only)       Balance Overall balance  assessment: Needs assistance           Standing balance-Leahy Scale: Fair                               Pertinent Vitals/Pain Pain Assessment: No/denies pain (had a mild headache but not now)    Home Living Family/patient expects to be discharged to:: Private residence Living Arrangements: Children Available Help at Discharge: Family;Available 24 hours/day Type of Home: House       Home Layout: Two level Home Equipment: None Additional Comments: son reports he works night shift but sister can come stay with patient    Prior Function Level of Independence: Independent         Comments: pt able to dress, bath, cook and amb without AD per son     Hand Dominance        Extremity/Trunk Assessment   Upper Extremity Assessment: Overall WFL for tasks assessed           Lower Extremity Assessment: Overall WFL for tasks assessed      Cervical / Trunk Assessment: Normal  Communication   Communication: Prefers language other than English (from the [place])  Cognition Arousal/Alertness: Awake/alert Behavior During Therapy: WFL for tasks assessed/performed Overall Cognitive Status: History of cognitive impairments - at baseline (typically doesn't know the date/time)                      General Comments General comments (skin integrity, edema, etc.): used son and pastor via phone as translators  Exercises        Assessment/Plan    PT Assessment Patient needs continued PT services  PT Diagnosis Difficulty walking;Generalized weakness   PT Problem List Decreased strength;Decreased range of motion;Decreased activity tolerance;Decreased balance;Decreased mobility  PT Treatment Interventions DME instruction;Gait training;Stair training;Functional mobility training;Therapeutic activities;Therapeutic exercise;Balance training   PT Goals (Current goals can be found in the Care Plan section) Acute Rehab PT Goals Patient Stated Goal: home PT  Goal Formulation: With patient Time For Goal Achievement: 12/22/15 Potential to Achieve Goals: Good Additional Goals Additional Goal #1: Pt to score > 19 on DGI to indicate minimal falls risk.    Frequency Min 3X/week   Barriers to discharge        Co-evaluation               End of Session Equipment Utilized During Treatment: Gait belt Activity Tolerance: Patient tolerated treatment well Patient left: in bed;with call bell/phone within reach;with bed alarm set;with family/visitor present Nurse Communication: Mobility status    Functional Assessment Tool Used: clinical judgement Functional Limitation: Mobility: Walking and moving around Mobility: Walking and Moving Around Current Status [id number]): At least 20 percent but less than 40 percent impaired, limited or restricted Mobility: Walking and Moving Around Goal Status [phone]): At least 1 percent but less than 20 percent impaired, limited or restricted    Time: 0924-0943 PT Time Calculation (min) (ACUTE ONLY): 19 min   Charges:   PT Evaluation $PT Eval Moderate Complexity: 1 Procedure     PT G Codes:   PT G-Codes **NOT FOR INPATIENT CLASS** Functional Assessment Tool Used: clinical judgement Functional Limitation: Mobility: Walking and moving around Mobility: Walking and Moving Around Current Status [id number]): At least 20 percent but less than 40 percent impaired, limited or restricted Mobility: Walking and Moving Around Goal Status [phone]): At least 1 percent but less than 20 percent impaired, limited or restricted    Kingsley Callander 12/15/2015, 10:15 AM   Kittie Plater, PT, DPT Pager #: [phone] Office #: [phone]

## 2015-12-15 NOTE — Progress Notes (Signed)
Nsg Discharge Note  Admit Date:  12/12/2015 Discharge date: 12/15/2015   Stephanie Swanson to be D/C'd Home per MD order.  AVS completed.  Copy for chart, and copy for patient signed, and dated. Patient/caregiver able to verbalize understanding.  Discharge Medication:   Medication List    STOP taking these medications   acetaminophen 650 MG CR tablet Commonly known as:  TYLENOL   esomeprazole 40 MG capsule Commonly known as:  NEXIUM   pantoprazole 40 MG tablet Commonly known as:  PROTONIX     TAKE these medications   amitriptyline 10 MG tablet Commonly known as:  ELAVIL Take 1 tablet (10 mg total) by mouth at bedtime. For migraine headache   amoxicillin 500 MG capsule Commonly known as:  AMOXIL Take 1 capsule (500 mg total) by mouth every 8 (eight) hours. For possible  urinary tract infection   fluticasone 50 MCG/ACT nasal spray Commonly known as:  FLONASE Place 2 sprays into both nostrils daily.   lactulose 10 GM/15ML solution Commonly known as:  CHRONULAC Take 45 mLs (30 g total) by mouth 2 (two) times daily. For elevated ammonia level       Discharge Assessment: Vitals:   12/15/15 1057 12/15/15 1536  BP: 134/67 (!) 153/75  Pulse: 85 96  Resp: 18 18  Temp: 97.8 F (36.6 C)    Skin clean, dry and intact without evidence of skin break down, no evidence of skin tears noted. IV catheter discontinued intact. Site without signs and symptoms of complications - no redness or edema noted at insertion site, patient denies c/o pain - only slight tenderness at site.  Dressing with slight pressure applied.  D/c Instructions-Education: Discharge instructions given to patient/family with verbalized understanding. D/c education completed with patient/family including follow up instructions, medication list, d/c activities limitations if indicated, with other d/c instructions as indicated by MD - patient able to verbalize understanding, all questions fully  answered. Patient instructed to return to ED, call 911, or call MD for any changes in condition.  Patient escorted via [place], and D/C home via private auto.  Stephanie Swanson, Thornell Mule, RN 12/15/2015 4:02 PM  Nsg Discharge Note  Admit Date:  12/12/2015 Discharge date: 12/15/2015   Stephanie Swanson to be D/C'd Home per MD order.  AVS completed.  Copy for chart, and copy for patient signed, and dated. Patient/caregiver able to verbalize understanding.  Discharge Medication:   Medication List    STOP taking these medications   acetaminophen 650 MG CR tablet Commonly known as:  TYLENOL   esomeprazole 40 MG capsule Commonly known as:  NEXIUM   pantoprazole 40 MG tablet Commonly known as:  PROTONIX     TAKE these medications   amitriptyline 10 MG tablet Commonly known as:  ELAVIL Take 1 tablet (10 mg total) by mouth at bedtime. For migraine headache   amoxicillin 500 MG capsule Commonly known as:  AMOXIL Take 1 capsule (500 mg total) by mouth every 8 (eight) hours. For possible  urinary tract infection   fluticasone 50 MCG/ACT nasal spray Commonly known as:  FLONASE Place 2 sprays into both nostrils daily.   lactulose 10 GM/15ML solution Commonly known as:  CHRONULAC Take 45 mLs (30 g total) by mouth 2 (two) times daily. For elevated ammonia level       Discharge Assessment: Vitals:   12/15/15 1057 12/15/15 1536  BP: 134/67 (!) 153/75  Pulse: 85 96  Resp: 18 18  Temp: 97.8 F (36.6 C)    Skin  clean, dry and intact without evidence of skin break down, no evidence of skin tears noted. IV catheter discontinued intact. Site without signs and symptoms of complications - no redness or edema noted at insertion site, patient denies c/o pain - only slight tenderness at site.  Dressing with slight pressure applied.  D/c Instructions-Education: Discharge instructions given to patient/family with verbalized understanding. D/c education completed with patient/family including  follow up instructions, medication list, d/c activities limitations if indicated, with other d/c instructions as indicated by MD - patient able to verbalize understanding, all questions fully answered. Patient instructed to return to ED, call 911, or call MD for any changes in condition.  Patient escorted via [place], and D/C home via private auto.  Stephanie Swanson, Thornell Mule, RN 12/15/2015 4:02 PM  Nsg Discharge Note  Admit Date:  12/12/2015 Discharge date: 12/15/2015   Stephanie Swanson to be D/C'd Home per MD order.  AVS completed.  Copy for chart, and copy for patient signed, and dated. Patient/caregiver able to verbalize understanding.  Discharge Medication:   Medication List    STOP taking these medications   acetaminophen 650 MG CR tablet Commonly known as:  TYLENOL   esomeprazole 40 MG capsule Commonly known as:  NEXIUM   pantoprazole 40 MG tablet Commonly known as:  PROTONIX     TAKE these medications   amitriptyline 10 MG tablet Commonly known as:  ELAVIL Take 1 tablet (10 mg total) by mouth at bedtime. For migraine headache   amoxicillin 500 MG capsule Commonly known as:  AMOXIL Take 1 capsule (500 mg total) by mouth every 8 (eight) hours. For possible  urinary tract infection   fluticasone 50 MCG/ACT nasal spray Commonly known as:  FLONASE Place 2 sprays into both nostrils daily.   lactulose 10 GM/15ML solution Commonly known as:  CHRONULAC Take 45 mLs (30 g total) by mouth 2 (two) times daily. For elevated ammonia level       Discharge Assessment: Vitals:   12/15/15 1057 12/15/15 1536  BP: 134/67 (!) 153/75  Pulse: 85 96  Resp: 18 18  Temp: 97.8 F (36.6 C)    Skin clean, dry and intact without evidence of skin break down, no evidence of skin tears noted. IV catheter discontinued intact. Site without signs and symptoms of complications - no redness or edema noted at insertion site, patient denies c/o pain - only slight tenderness at site.  Dressing with  slight pressure applied.  D/c Instructions-Education: Discharge instructions given to patient/family with verbalized understanding. D/c education completed with patient/family including follow up instructions, medication list, d/c activities limitations if indicated, with other d/c instructions as indicated by MD - patient able to verbalize understanding, all questions fully answered. Patient instructed to return to ED, call 911, or call MD for any changes in condition.  Patient escorted via [place], and D/C home via private auto.  Shakai Dolley Margaretha Sheffield, RN 12/15/2015 4:02 PM

## 2015-12-15 NOTE — Discharge Summary (Signed)
Discharge Summary  Stephanie Swanson YKD:[id number] DOB: 09-23-1955  PCP: Stephanie Sabal, MD  Admit date: 12/12/2015 Discharge date: 12/15/2015  Time spent: >80mns  Recommendations for Outpatient Follow-up:  1. F/u with PMD Dr Stephanie Raiderwithin a week  for hospital discharge follow up, repeat cbc/bmp at follow up 2. F/u with [facility] for chronic headache, confusion 3. F/u with infectious disease for hepatitis C  Discharge Diagnoses:  Active Hospital Problems   Diagnosis Date Noted  . Altered mental status 12/12/2015    Resolved Hospital Problems   Diagnosis Date Noted Date Resolved  No resolved problems to display.    Discharge Condition: stable  Diet recommendation: regular diet  Filed Weights   12/12/15 2154  Weight: 79.8 kg (176 lb)    History of present illness:  HPI: [name] a 60y.o. female with medical history significant of HTN, GERD, takes some HTN med at home but dosent remember what.  Patient brought to ED by family after they found her with AMS on the couch today, LKW 5pm.  Initially per EMS patient responsive to pain only.  ED Course: Since arrival she appears to have improved some.  Now responding to verbal commands with all 4 extremities (commands given by family members who speak her language), but she still hasnt said anything verbally since onset of symptoms.  Concern for stroke.  Apparently she was following commands with right side but not left earlier in the ED this evening.   Hospital Course:  Active Problems:   Altered mental status   Confusion:  MRI brain no stroke. Broad differential including migraine headache variant, seizure, metabolic encephalopathy.  Family report patient does not drink alcohol, no illicit drug use or herbal medicine. Denies h/o seizure activities.  ua unremarkable/uds negative/ammonia mildly elevated /b12 wnl /thiamine pending /tsh wnl /hiv negative /rpr negative /esr wnl Prior to  discharge on 8/15, her urine culture result showed 30,000 colonies of strep viridans, she does not have leukocytosis, no fever, but due to initially presentation with confusion with unclear etiology, will treat with amoxicillin for 5 days.  8/15 Fully alert today, She passed swallow eval, started on regular diet She is not oriented to time, eeg no seizure activity, neurology consulted, input appreciated. He is back to baseline and cleared to discharge home by neurology with outpatient neurology follow up. Detail please see neurology consult note.  Chronic headache: does has sinus symptom, will provide flonase, consider outpatient sleep study to r/o sleep apnea. Neurology recommended start low dose amitriptyline qhs for migraine headache prophylaxis  Mild elevation of lft: + hepc,  viral load in process Liver u[place] Cholelithiasis.  No biliary dilatation.  Liver is mildly heterogeneous. Difficult to exclude liver nodularity. Cirrhotic changes cannot be excluded based on the sonographic appearance. She is to follow up with infectious disease.  Elevated ammonia level, trial of lactulose  Hypokalemia: replace k  Weakness: PT, home health   Code Status: full  Family Communication: patient , daughter and son in room, daughter speaks english as a tOptometristDaughter requested work note which is provided  Disposition Plan: home on 8/15 with pmd/ID/neurology follow up   Consultants:  neurology  Procedures:  EEG  Antibiotics:  Amoxicillin    Discharge Exam: BP 134/67 (BP Location: Right Arm)   Pulse 85   Temp 97.8 F (36.6 C)   Resp 18   Ht _0  (1.651 m)   Wt 79.8 kg (176 lb)   SpO2 96%   BMI 29.29 kg/m  General:  Fully alert, following commands, smiling and  talking to family, not oriented to time, but to person and know she is at [employer], (per family patient has never been in the school her whole life, she does not read or write, she does not know  the time at baseline, but family does not speak good english, not sure if the information is reliable)  Cardiovascular: RRR  Respiratory: CTABL  Abdomen: Soft/ND/NT, positive BS  Musculoskeletal: No Edema  Neuro: Fully alert, following commands, smiling and  talking to family, not oriented to time, but to person and know she is at [facility], no focal deficit appreciated  Discharge Instructions You were cared for by a hospitalist during your hospital stay. If you have any questions about your discharge medications or the care you received while you were in the hospital after you are discharged, you can call the unit and asked to speak with the hospitalist on call if the hospitalist that took care of you is not available. Once you are discharged, your primary care physician will handle any further medical issues. Please note that NO REFILLS for any discharge medications will be authorized once you are discharged, as it is imperative that you return to your primary care physician (or establish a relationship with a primary care physician if you do not have one) for your aftercare needs so that they can reassess your need for medications and monitor your lab values.  Discharge Instructions    Ambulatory referral to Neurology    Complete by:  As directed   An appointment is requested in approximately: 2 weeks for chronic daily HA. Medication started in hospital for this.   Diet general    Complete by:  As directed   Increase activity slowly    Complete by:  As directed       Medication List    STOP taking these medications   acetaminophen 650 MG CR tablet Commonly known as:  TYLENOL   esomeprazole 40 MG capsule Commonly known as:  NEXIUM   pantoprazole 40 MG tablet Commonly known as:  PROTONIX     TAKE these medications   amitriptyline 10 MG tablet Commonly known as:  ELAVIL Take 1 tablet (10 mg total) by mouth at bedtime. For migraine headache   amoxicillin 500 MG  capsule Commonly known as:  AMOXIL Take 1 capsule (500 mg total) by mouth every 8 (eight) hours. For possible  urinary tract infection   fluticasone 50 MCG/ACT nasal spray Commonly known as:  FLONASE Place 2 sprays into both nostrils daily.   lactulose 10 GM/15ML solution Commonly known as:  CHRONULAC Take 45 mLs (30 g total) by mouth 2 (two) times daily. For elevated ammonia level      No Known Allergies Follow-up Information    WALDEN,JEFF, MD Follow up in 1 week(s).   Specialty:  Family Medicine Why:  hospital discharge follow up, repeat cbc/cmp/ ammonia level at discharge. pmd to refer patient to have sleep study done to rule out sleep apnea as a cause of chronic headache. Contact information: [place] [address] [phone]        [facility] Follow up in 1 month(s).   Why:  chronic headache and confusion Contact information: [address]     [address] [place] [address] [place]  Follow up in 1 month(s).   Why:  hepatitis c Contact information: [address] [place] [address]           The results of significant diagnostics from this hospitalization (including imaging, microbiology, ancillary and laboratory) are listed below for reference.    Significant Diagnostic Studies: Dg Chest 2 View  Result Date: 12/13/2015 CLINICAL DATA:  Altered mental status. EXAM: CHEST  2 VIEW COMPARISON:  None. FINDINGS: Poor inspiration. Grossly normal sized heart. Clear lungs. Mildly prominent interstitial markings. Mild scoliosis. IMPRESSION: No acute abnormality.  Mild chronic interstitial lung disease. Electronically Signed   By: Claudie Revering M.D.   On: 12/13/2015 02:23   Mr Brain Wo Contrast  Result Date: 12/13/2015 CLINICAL DATA:  Initial valuation for acute altered mental status. EXAM: MRI HEAD WITHOUT  CONTRAST TECHNIQUE: Multiplanar, multiecho pulse sequences of the brain and surrounding structures were obtained without intravenous contrast. COMPARISON:  Prior CT from 12/12/2015. FINDINGS: Age appropriate cerebral atrophy present. No significant cerebral white matter disease. No abnormal foci of restricted diffusion to suggest acute or subacute ischemia. Gray-white matter differentiation well maintained. Major intracranial vascular flow voids are preserved. No acute or chronic intracranial hemorrhage. No areas of chronic infarction. No mass lesion, midline shift, or mass effect. No hydrocephalus. No extra-axial fluid collection. Major dural sinuses are grossly patent. Craniocervical junction normal. Visualized upper cervical spine unremarkable. Pituitary gland within normal limits. No acute abnormality about the globes and orbits. Mild mucosal thickening within the ethmoidal air cells and maxillary sinuses. No mastoid effusion. Inner ear structures grossly normal. Bone marrow signal intensity within normal limits. Hyperostosis frontalis interna noted. No scalp soft tissue abnormality. IMPRESSION: Normal brain MRI.  No acute intracranial process identified. Electronically Signed   By: Jeannine Boga M.D.   On: 12/13/2015 02:31   Ct Head Code Stroke W/o Cm  Result Date: 12/12/2015 CLINICAL DATA:  Code stroke. Patient found unresponsive, with altered mental status. Initial encounter. EXAM: CT HEAD WITHOUT CONTRAST TECHNIQUE: Contiguous axial images were obtained from the base of the skull through the vertex without intravenous contrast. COMPARISON:  CT of the head performed 10/15/2014 FINDINGS: There is no evidence of acute infarction, mass lesion, or intra- or extra-axial hemorrhage on CT. A likely small chronic lacunar infarct is noted at the left external capsule. The posterior fossa, including the cerebellum, brainstem and fourth ventricle, is within normal limits. The third and lateral ventricles are  unremarkable in appearance. The cerebral hemispheres are symmetric in appearance, with normal gray-white differentiation. No mass effect or midline shift is seen. There is no evidence of fracture; visualized osseous structures are unremarkable in appearance. The visualized portions of the orbits are within normal limits. The paranasal sinuses and mastoid air cells are well-aerated. No significant soft tissue abnormalities are seen. ASPECTS [facility] Stroke Program Early CT Score, http://www.aspectsinstroke.com) - Ganglionic level infarction (caudate, lentiform nuclei, internal capsule, insula, M1-M3 cortex): 7 - Supraganglionic infarction (M4-M6 cortex): 3 Total score (0-10 with 10 being normal): 10 IMPRESSION: 1. No acute intracranial pathology seen on CT. 2. 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 3. ASPECTS score 10 These results were called by telephone at the time of interpretation on 12/12/2015 at 11:02 pm to Dr. Nicole Kindred, who verbally acknowledged these results. Electronically Signed   By: Garald Balding M.D.   On: 12/12/2015 23:03   US Abdomen Limited Ruq  Result Date: 12/14/2015 CLINICAL DATA:  Elevated LFTs. EXAM: US ABDOMEN LIMITED -  RIGHT UPPER QUADRANT COMPARISON:  12/05/2014 FINDINGS: Gallbladder: Echogenic material in the gallbladder appears to represent stones. Largest stone roughly measures 0.3 cm. Gallbladder is not distended. Reportedly, the patient does not have a sonographic Murphy's sign. Gallbladder wall measures 0.2 cm. Common bile duct: Diameter: 0.4 cm. Liver: Liver parenchyma is mildly heterogeneous. Liver contour is mildly nodular along the posterior aspect. No focal liver lesion. IMPRESSION: Cholelithiasis.  No biliary dilatation. Liver is mildly heterogeneous. Difficult to exclude liver nodularity. Cirrhotic changes cannot be excluded based on the sonographic appearance. Electronically Signed   By: Markus Daft M.D.   On: 12/14/2015 09:49     Microbiology: Recent Results (from the past 240 hour(s))  Urine culture     Status: Abnormal   Collection Time: 12/13/15  9:13 AM  Result Value Ref Range Status   Specimen Description URINE, CATHETERIZED  Final   Special Requests NONE  Final   Culture 30,000 COLONIES/mL VIRIDANS STREPTOCOCCUS (A)  Final   Report Status 12/15/2015 FINAL  Final     Labs: Basic Metabolic Panel:  Recent Labs Lab 12/12/15 2210 12/12/15 2237 12/14/15 0623 12/15/15 0607  NA 134* 140 138 139  K 4.2 4.4 3.2* 3.3*  CL 104 105 106 107  CO2 20*  --  25 23  GLUCOSE 103* 100* 80 91  BUN _0 <5*  CREATININE 0.82 0.70 0.69 0.58  CALCIUM 8.5*  --  8.6* 8.5*  MG  --   --  2.1  --    Liver Function Tests:  Recent Labs Lab 12/12/15 2210 12/14/15 0623 12/15/15 0607  AST 52* 48* 50*  ALT 37 34 38  ALKPHOS 99 91 89  BILITOT 0.6 1.0 1.1  PROT 7.4 7.2 8.0  ALBUMIN 3.2* 2.9* 3.1*   No results for input(s): LIPASE, AMYLASE in the last 168 hours.  Recent Labs Lab 12/13/15 0914 12/15/15 0607  AMMONIA 43* 36*   CBC:  Recent Labs Lab 12/12/15 2210 12/12/15 2237 12/14/15 0623  WBC 6.1  --  7.3  NEUTROABS 4.6  --   --   HGB 14.7 15.0 13.8  HCT 44.0 44.0 41.9  MCV 92.1  --  91.9  PLT 186  --  183   Cardiac Enzymes: No results for input(s): CKTOTAL, CKMB, CKMBINDEX, TROPONINI in the last 168 hours. BNP: BNP (last 3 results) No results for input(s): BNP in the last 8760 hours.  ProBNP (last 3 results) No results for input(s): PROBNP in the last 8760 hours.  CBG:  Recent Labs Lab 12/13/15 2007  GLUCAP 88       Signed:  Maryela Tapper MD, PhD  [facility] 12/15/2015, 2:53 PM

## 2015-12-15 NOTE — Care Management Note (Addendum)
Case Management Note  Patient Details  Name: Stephanie Swanson MRN: [id number] Date of Birth: May 10, 1955  Subjective/Objective:                 Patient in obs for AMS.Rule out migraine vs seizure. Pending EEG, SLP PT consults, Neuro team consulting.    Action/Plan:  Dc to home today in care of family. HH PT not covered by Medicaid for this diagnosis.   Expected Discharge Date:                  Expected Discharge Plan:  Home/Self Care  In-House Referral:  NA  Discharge planning Services  CM Consult  Post Acute Care Choice:  NA Choice offered to:  NA  DME Arranged:  N/A DME Agency:  NA  HH Arranged:  NA HH Agency:  NA  Status of Service:  Completed, signed off  If discussed at [place] of Stay Meetings, dates discussed:    Additional Comments:  Carles Collet, RN 12/15/2015, 11:33 AM

## 2015-12-15 NOTE — Progress Notes (Signed)
Subjective: Patient is laying comfortably in her bed and complains of mild headache but no other complaints. She does not speak [place] and son has limited [place]. Son states that she is back to her baseline.  Exam: Vitals:   12/15/15 0602 12/15/15 1057  BP: 133/71 134/67  Pulse: 81 85  Resp: 18 18  Temp: 97.9 F (36.6 C) 97.8 F (36.6 C)    HEENT-  Normocephalic, no lesions, without obvious abnormality.  Normal external eye and conjunctiva.  Normal TM's bilaterally.  Normal auditory canals and external ears. Normal external nose, mucus membranes and septum.  Normal pharynx. Cardiovascular- regular rate and rhythm, pulses palpable throughout   Lungs- chest clear, no wheezing, rales, normal symmetric air entry, Heart exam - S1, S2 normal, no murmur, no gallop, rate regular Abdomen- normal findings: bowel sounds normal Extremities- no edema Lymph-no adenopathy palpable Musculoskeletal-no joint tenderness, deformity or swelling Skin-warm and dry, no hyperpigmentation, vitiligo, or suspicious lesions    Gen: In bed, NAD MS: Patient is alert and able to follow all commands to the best of her ability due to language barrier. Son is in room helping however he has limited [place]. CN: Pupils are equal round reactive to light, EOMI, face is symmetrical, tongue is midline, sensation is intact. Motor: Moving all extremities antigravity with 5/5 strength. Sensory: Sensation is grossly intact.   Pertinent Labs/Diagnostics: EEG:  Shows mild diffuse slowing in the background. She has independent focal slowing over bilateral temporal regions left greater than right. But no epileptiform activity  MRI: MRI shows no intracranial process. Normal for age.  Etta Quill PA-C [facility] [phone]  Impression: 60 year old female presenting with altered mental status which has cleared at this time. Both MRI and EEG show no etiology for altered mental status.  Per son she is back to  baseline. On admission patient did have slightly elevated ammonia 43 which has subsequently decreased to 36.    Recommendations:     12/15/2015, 12:16 PM

## 2015-12-16 LAB — HCV RNA QUANT RFLX ULTRA OR GENOTYP
HCV RNA QNT(LOG COPY/ML): 5.875 {Log_IU}/mL
HepC Qn: 750000 IU/mL

## 2015-12-16 LAB — HEPATITIS C GENOTYPE: HEPATITIS C GENOTYPE: 4

## 2015-12-17 LAB — VITAMIN B1: Vitamin B1 (Thiamine): 117 nmol/L (ref 66.5–200.0)

## 2015-12-21 ENCOUNTER — Ambulatory Visit (INDEPENDENT_AMBULATORY_CARE_PROVIDER_SITE_OTHER): Payer: Medicaid Other | Admitting: Internal Medicine

## 2015-12-21 ENCOUNTER — Encounter: Payer: Self-pay | Admitting: Internal Medicine

## 2015-12-21 VITALS — BP 152/79 | HR 89 | Temp 97.9°F | Wt 178.0 lb

## 2015-12-21 DIAGNOSIS — B182 Chronic viral hepatitis C: Secondary | ICD-10-CM | POA: Insufficient documentation

## 2015-12-21 DIAGNOSIS — R519 Headache, unspecified: Secondary | ICD-10-CM

## 2015-12-21 DIAGNOSIS — R51 Headache: Secondary | ICD-10-CM

## 2015-12-21 DIAGNOSIS — B1920 Unspecified viral hepatitis C without hepatic coma: Secondary | ICD-10-CM | POA: Diagnosis not present

## 2015-12-21 DIAGNOSIS — E876 Hypokalemia: Secondary | ICD-10-CM | POA: Diagnosis not present

## 2015-12-21 LAB — POTASSIUM: Potassium: 3.8 mmol/L (ref 3.5–5.3)

## 2015-12-21 NOTE — Assessment & Plan Note (Signed)
Potassium 3.3 on the day of discharge from the hospital.  - Re-check potassium today

## 2015-12-21 NOTE — Assessment & Plan Note (Signed)
Improved with Amitriptyline. - Continue Amitriptyline 10mg  qhs - Follow-up with Neurology

## 2015-12-21 NOTE — Patient Instructions (Addendum)
It was nice to meet you!  Please continue to take the medications that were prescribed to you in the hospital.  Please call the infectious disease doctor and the brain doctor (Neurology) to schedule appointments.  Follow-up with Dr. Mingo Amber in 3 months  -Dr. Brett Albino

## 2015-12-21 NOTE — Progress Notes (Signed)
   [facility] Phone: [phone]  Subjective:  Pt presents for hospital follow-up visit. She was hospitalized for headache and AMS. Pt was seen by Neurology, who started her on Amitriptyline for migraine prophylaxis. She was also diagnosed with Hepatitis C during her admission, and instructed to follow-up in ID clinic. Since leaving the hospital, her headache is much better. She states she has still had some confusion. She also endorses fatigue. She has no other concerns.  ROS: See HPI for pertinent positives and negatives Past Medical History- Hepatitis C Reviewed problem list.  Medications- reviewed and updated Current Outpatient Prescriptions  Medication Sig Dispense Refill  . amitriptyline (ELAVIL) 10 MG tablet Take 1 tablet (10 mg total) by mouth at bedtime. For migraine headache 30 tablet 0  . amoxicillin (AMOXIL) 500 MG capsule Take 1 capsule (500 mg total) by mouth every 8 (eight) hours. For possible  urinary tract infection 15 capsule 0  . fluticasone (FLONASE) 50 MCG/ACT nasal spray Place 2 sprays into both nostrils daily. 16 g 2  . lactulose (CHRONULAC) 10 GM/15ML solution Take 45 mLs (30 g total) by mouth 2 (two) times daily. For elevated ammonia level 240 mL 0   No current facility-administered medications for this visit.    Chief complaint-noted Family history reviewed for today's visit. No changes. Social history- patient is a never smoker.  Objective: BP (!) 152/79   Pulse 89   Temp 97.9 F (36.6 C) (Oral)   Wt 178 lb (80.7 kg)   SpO2 98%   BMI 29.62 kg/m  Gen: NAD, alert, cooperative with exam HEENT: NCAT, EOMI, MMM Neck: FROM, supple CV: RRR, no murmur Resp: CTABL, no wheezes, normal work of breathing GI: +BS, RUQ and epigastric area mildly tender to palpation, no rebound or guarding, no hepatosplenomegaly Neuro: Alert and oriented, no gross deficits Skin: No rashes, no lesions Psych: Appropriate  behavior  Assessment/Plan: Hepatitis C: Pt with recent diagnosis of Hep C. Abdominal US unable to exclude cirrhosis. - Follow-up in ID clinic  Hypokalemia: Potassium 3.3 on the day of discharge from the hospital.  - Re-check potassium today  Headache: Improved with Amitriptyline. - Continue Amitriptyline 10mg  qhs - Follow-up with Neurology   Hyman Bible, MD PGY-2

## 2015-12-21 NOTE — Assessment & Plan Note (Signed)
Pt with recent diagnosis of Hep C. Abdominal US unable to exclude cirrhosis. - Follow-up in ID clinic

## 2016-01-27 ENCOUNTER — Ambulatory Visit (INDEPENDENT_AMBULATORY_CARE_PROVIDER_SITE_OTHER): Payer: Medicaid Other | Admitting: Neurology

## 2016-01-27 ENCOUNTER — Encounter: Payer: Self-pay | Admitting: Neurology

## 2016-01-27 VITALS — BP 130/80 | HR 88 | Ht 64.0 in | Wt 180.0 lb

## 2016-01-27 DIAGNOSIS — G43011 Migraine without aura, intractable, with status migrainosus: Secondary | ICD-10-CM | POA: Diagnosis not present

## 2016-01-27 DIAGNOSIS — H832X3 Labyrinthine dysfunction, bilateral: Secondary | ICD-10-CM | POA: Diagnosis not present

## 2016-01-27 DIAGNOSIS — G450 Vertebro-basilar artery syndrome: Secondary | ICD-10-CM

## 2016-01-27 DIAGNOSIS — H832X9 Labyrinthine dysfunction, unspecified ear: Secondary | ICD-10-CM | POA: Insufficient documentation

## 2016-01-27 DIAGNOSIS — E049 Nontoxic goiter, unspecified: Secondary | ICD-10-CM | POA: Diagnosis not present

## 2016-01-27 DIAGNOSIS — R110 Nausea: Secondary | ICD-10-CM | POA: Diagnosis not present

## 2016-01-27 MED ORDER — PROMETHAZINE HCL 25 MG PO TABS: 25.0000 mg | ORAL_TABLET | Freq: Four times a day (QID) | ORAL | 1 refills | Status: DC | PRN

## 2016-01-27 MED ORDER — TOPIRAMATE 25 MG PO TABS: 25.0000 mg | ORAL_TABLET | Freq: Two times a day (BID) | ORAL | 3 refills | Status: DC

## 2016-01-27 MED ORDER — IBUPROFEN 600 MG PO TABS: 600.0000 mg | ORAL_TABLET | Freq: Four times a day (QID) | ORAL | 0 refills | Status: DC | PRN

## 2016-01-27 NOTE — Addendum Note (Signed)
Addended by: Larey Seat on: 01/27/2016 03:09 PM   Modules accepted: Orders

## 2016-01-27 NOTE — Progress Notes (Signed)
Provider:  Larey Seat, M D  Referring Provider: Alveda Reasons, MD Primary Care Physician:  Stephanie Sabal, MD  Chief Complaint  Patient presents with  . New Patient (Initial Visit)    headaches, using a [place] interpreter    HPI:  Stephanie Swanson is a 60 y.o.  [place] female is seen here as a referral from Dr. Erlinda Swanson, [facility] hospital[facility] for the [facility]. A translator and her daughter are present.  The patient presented to the emergency room in a state of decreased alertness, admitted on 12/12/2015 due to responsiveness only to pain, she was brought in by ambulance after her family had found her on the couch not responding to verbal stimuli or touching. After she presented to the emergency room she appeared to improve continuously and responded to verbal commands. Her family members translated in the emergency room and she was able to move all 4 extremities. She was worked up for possible stroke but her MRI was negative and his stroke workup came back negative. She reported that she had a severe headache. She is not using any herbal medicines, illicit drugs she does not drink alcohol, she has no history of a seizure disorder, she was tested for ammonia levels, and these were mildly elevated, vitamin B12 level was within normal limits, thiamine was normal, thyroid-stimulating hormone was normal, an HIV test was negative, RPR was negative, and sedimentation rate was within normal limits. She had a urinary tract infection with colonies of Streptococcus viridans. He was given amoxicillin for 5 days. On August 15 she was discharged after she passed a swallow test EEG was normal, neurology consult stated that outpatient follow-up with Dr. Mingo Swanson, primary care physician, is recommended. There also recommended that she start low dose amitriptyline at night for migraine treatment and prophylaxis. She was recommended to see PCP within a week, but hasn't. She was told to  see infectious disease,  had blood drawn..   Her daughter reports that her mother was again unsteady on Sunday and fell after the church service. She is having dizziness. She described a spinning sensation, severe vertigo.  She recalls having a similar sensation before developing severe headaches and then amnesia on the day that she presented to the emergency department.   Review of Systems: Out of a complete 14 system review, the patient complains of only the following symptoms, and all other reviewed systems are negative. Vertigo, headaches, falling. She reports vertigo onset was sudden head movements. No peripheral sensory loss, normal pulses.  Social History   Social History  . Marital status: Single    Spouse name: N/A  . Number of children: 4  . Years of education: N/A   Occupational History  . Not on file.   Social History Main Topics  . Smoking status: Never Smoker  . Smokeless tobacco: Never Used  . Alcohol use No  . Drug use: No  . Sexual activity: Not on file   Other Topics Concern  . Not on file   Social History Narrative   Immigrant Social History:   - Name spelling correct?: yes   - Date arrived in [place]: 09/11/14   - Country of origin: [place]   - Location of refugee camp (if applicable), how long there, and what caused patient to leave home country?: was in refugee camp is [place]. Was there a long time. Left because of the war.   - Primary language: kinyarwanda   -Requires intepreter (essentially speaks no  [place])   - Education: Highest level of education: no schooling   - Prior work: Psychologist, sport and exercise   - Games developer name and number: [id number]   - Tobacco/alcohol/drug use: no   - Marriage Status: widowed   - Sexual activity: no   - Were you beaten or tortured in your country or refugee camp? no   - if yes: Are you having bad dreams about your experience?    Do you feel "jumpy" or "nervous?"     Do you feel that the experience is happening again?    Are you "super alert" or watchful?     No family history on file.  Past Medical History:  Diagnosis Date  . Headache   . Hypertension     No past surgical history on file.  Current Outpatient Prescriptions  Medication Sig Dispense Refill  . amitriptyline (ELAVIL) 10 MG tablet Take 1 tablet (10 mg total) by mouth at bedtime. For migraine headache 30 tablet 0  . amoxicillin (AMOXIL) 500 MG capsule Take 1 capsule (500 mg total) by mouth every 8 (eight) hours. For possible  urinary tract infection 15 capsule 0  . fluticasone (FLONASE) 50 MCG/ACT nasal spray Place 2 sprays into both nostrils daily. 16 g 2  . lactulose (CHRONULAC) 10 GM/15ML solution Take 45 mLs (30 g total) by mouth 2 (two) times daily. For elevated ammonia level 240 mL 0   No current facility-administered medications for this visit.    EXAM: MRI HEAD WITHOUT CONTRAST  TECHNIQUE: Multiplanar, multiecho pulse sequences of the brain and surrounding structures were obtained without intravenous contrast.  COMPARISON:  Prior CT from 12/12/2015.  FINDINGS: Age appropriate cerebral atrophy present. No significant cerebral white matter disease.  No abnormal foci of restricted diffusion to suggest acute or subacute ischemia. Gray-white matter differentiation well maintained. Major intracranial vascular flow voids are preserved. No acute or chronic intracranial hemorrhage. No areas of chronic infarction.  No mass lesion, midline shift, or mass effect. No hydrocephalus. No extra-axial fluid collection. Major dural sinuses are grossly patent.  Craniocervical junction normal. Visualized upper cervical spine unremarkable.  Pituitary gland within normal limits. No acute abnormality about the globes and orbits.  Mild mucosal thickening within the ethmoidal air cells and maxillary sinuses. No mastoid  effusion. Inner ear structures grossly normal.  Bone marrow signal intensity within normal limits. Hyperostosis frontalis interna noted. No scalp soft tissue abnormality.  IMPRESSION: Normal brain MRI.  No acute intracranial process identified.   Electronically Signed   By: Jeannine Boga M.D.   On: 12/13/2015 02:31  Allergies as of 01/27/2016  . (No Known Allergies)    Vitals: BP 130/80   Pulse 88   Ht 5\' 4"  (1.626 m)   Wt 180 lb (81.6 kg)   BMI 30.90 kg/m  Last Weight:  Wt Readings from Last 1 Encounters:  01/27/16 180 lb (81.6 kg)   Last Height:   Ht Readings from Last 1 Encounters:  01/27/16 5\' 4"  (1.626 m)    Physical exam:  General: The patient is awake, alert and appears not in acute distress. The patient is well groomed. Head: Normocephalic, atraumatic. Neck is supple. Mallampati 2, neck circumference: 16 with a very large, hardened goiter.  Cardiovascular:  Regular rate and rhythm, without  murmurs or carotid bruit. Respiratory: Lungs are clear to auscultation. Skin:  Without evidence of edema, or rash,  Trunk: BMI is  elevated and patient has normal posture.  Neurologic exam : The patient is awake and alert,  oriented to place and time.  Speech - can not be judged. affect is appropriate.  Cranial nerves: Pupils are equal and briskly reactive to light.  Funduscopic exam without evidence of pallor or edema. Extraocular movements  in vertical and horizontal planes intact and without nystagmus.  Visual fields by finger perimetry are intact. Hearing to finger rub intact.   Facial sensation intact to fine touch. Facial motor strength is symmetric and tongue and uvula move midline.  Tongue protrusion into either cheek is normal. Shoulder shrug is normal.   Motor exam: Normal tone ,muscle bulk and symmetric  strength in all extremities.  Sensory:  Fine touch, pinprick and vibration were tested in all extremities.  Proprioception was  normal.  Coordination: Rapid alternating movements in the fingers/hands were normal. Finger-to-nose maneuver  normal without evidence of ataxia, dysmetria or tremor.  Gait and station: Patient walks without assistive device and is able unassisted to climb up to the exam table. Strength within normal limits. Stance is stable and normal. Tandem gait is unfragmented. Romberg testing is negative   Deep tendon reflexes: in the  upper and lower extremities are symmetric and intact. Babinski maneuver response is downgoing.   Assessment:  After physical and neurologic examination, review of laboratory studies, imaging, neurophysiology testing and pre-existing records, assessment is that of :   Stephanie Swanson presented today in the presence of her daughter and a translator after a emergency room visit on 12/13/2015.  It appears that the patient suffered a severe sudden onset of vertigo so crippling that she became severely nauseated and unable to move. She did not vomit but felt extremely nauseated. All this occurred in the setting of a severe headache. The only abnormality seen during her hospitalization with a urinary tract infection which was treated with antibiotics. She recovered to a normal level of consciousness within 2 days. Last Sunday she had another episode of vertigo, nausea and fell after church.  She also had a severe headache at the same time. She reported that the amitriptyline prescribed by the emergency room was not working.  Plan:  Treatment plan and additional workup :  I strongly suspect that she has a severe migraine form with vertebrobasilar involvement. This would account for the vertigo would also account for her nausea and the headache. She was amnestic for part of her events seems to pass out. She was already evaluated is a normal MRI and EEG. Interestingly, her referral for family physician was requested to include a sleep evaluation as it could contribute to severe  headaches. She has headaches every day, she does not have vertigo every day.  I will start the patient today on a migraine preventive medication by the name of topiramate-Topamax which also has antiepileptic qualities. This is not a medication that will treat the headache but prevent the headaches. I will also prescribe a pain medicine for her is that she can find a way to interrupt the headaches when already present. I will not use a triptan unless I know that she does not suffer from vertebrobasilar  Vaso-spasm. In the meantime, I will prescribe Ibuprofen 600 mg .   I will order a transcranial doppler, a home sleep test, and a topirmate 25 mg bid po.   Asencion Partridge Benyamin Jeff MD 01/27/2016

## 2016-01-27 NOTE — Patient Instructions (Addendum)
Ibuprofen tablets and capsules  What is this medicine?  IBUPROFEN (eye BYOO proe fen) is a non-steroidal anti-inflammatory drug (NSAID). It is used for dental pain, fever, headaches or migraines, osteoarthritis, rheumatoid arthritis, or painful monthly periods. It can also relieve minor aches and pains caused by a cold, flu, or sore throat.  This medicine may be used for other purposes; ask your health care provider or pharmacist if you have questions.  What should I tell my health care provider before I take this medicine?  They need to know if you have any of these conditions:  -asthma  -cigarette smoker  -drink more than 3 alcohol containing drinks a day  -heart disease or circulation problems such as heart failure or leg edema (fluid retention)  -high blood pressure  -kidney disease  -liver disease  -stomach bleeding or ulcers  -an unusual or allergic reaction to ibuprofen, aspirin, other NSAIDS, other medicines, foods, dyes, or preservatives  -pregnant or trying to get pregnant  -breast-feeding  How should I use this medicine?  Take this medicine by mouth with a glass of water. Follow the directions on the prescription label. Take this medicine with food if your stomach gets upset. Try to not lie down for at least 10 minutes after you take the medicine. Take your medicine at regular intervals. Do not take your medicine more often than directed.  A special MedGuide will be given to you by the pharmacist with each prescription and refill. Be sure to read this information carefully each time.  Talk to your pediatrician regarding the use of this medicine in children. Special care may be needed.  Overdosage: If you think you have taken too much of this medicine contact a poison control center or emergency room at once.  NOTE: This medicine is only for you. Do not share this medicine with others.  What if I miss a dose?  If you miss a dose, take it as soon as you can. If it is almost time for your next dose, take  only that dose. Do not take double or extra doses.  What may interact with this medicine?  Do not take this medicine with any of the following medications:  -cidofovir  -ketorolac  -methotrexate  -pemetrexed  This medicine may also interact with the following medications:  -alcohol  -aspirin  -diuretics  -lithium  -other drugs for inflammation like prednisone  -warfarin  This list may not describe all possible interactions. Give your health care provider a list of all the medicines, herbs, non-prescription drugs, or dietary supplements you use. Also tell them if you smoke, drink alcohol, or use illegal drugs. Some items may interact with your medicine.  What should I watch for while using this medicine?  Tell your doctor or healthcare professional if your symptoms do not start to get better or if they get worse.  This medicine does not prevent heart attack or stroke. In fact, this medicine may increase the chance of a heart attack or stroke. The chance may increase with longer use of this medicine and in people who have heart disease. If you take aspirin to prevent heart attack or stroke, talk with your doctor or health care professional.  Do not take other medicines that contain aspirin, ibuprofen, or naproxen with this medicine. Side effects such as stomach upset, nausea, or ulcers may be more likely to occur. Many medicines available without a prescription should not be taken with this medicine.  This medicine can cause ulcers and   drowsy or dizzy. Do not drive, use machinery, or do anything that needs mental alertness until you know how this medicine affects you. Do not stand or sit up quickly, especially if you are an older patient. This reduces the risk of  dizzy or fainting spells. This medicine can cause you to bleed more easily. Try to avoid damage to your teeth and gums when you brush or floss your teeth. This medicine may be used to treat migraines. If you take migraine medicines for 10 or more days a month, your migraines may get worse. Keep a diary of headache days and medicine use. Contact your healthcare professional if your migraine attacks occur more frequently. What side effects may I notice from receiving this medicine? Side effects that you should report to your doctor or health care professional as soon as possible: -allergic reactions like skin rash, itching or hives, swelling of the face, lips, or tongue -severe stomach pain -signs and symptoms of bleeding such as bloody or black, tarry stools; red or dark-brown urine; spitting up blood or brown material that looks like coffee grounds; red spots on the skin; unusual bruising or bleeding from the eye, gums, or nose -signs and symptoms of a blood clot such as changes in vision; chest pain; severe, sudden headache; trouble speaking; sudden numbness or weakness of the face, arm, or leg -unexplained weight gain or swelling -unusually weak or tired -yellowing of eyes or skin Side effects that usually do not require medical attention (report to your doctor or health care professional if they continue or are bothersome): -bruising -diarrhea -dizziness, drowsiness -headache -nausea, vomiting This list may not describe all possible side effects. Call your doctor for medical advice about side effects. You may report side effects to FDA at 1-800-FDA-1088. Where should I keep my medicine? Keep out of the reach of children. Store at room temperature between 15 and 30 degrees C (59 and 86 degrees F). Keep container tightly closed. Throw away any unused medicine after the expiration date. NOTE: This sheet is a summary. It may not cover all possible information. If you have questions about this  medicine, talk to your doctor, pharmacist, or health care provider.    2016, Elsevier/Gold Standard. (2012-12-18 10:48:02)  Topiramate extended-release capsules What is this medicine? TOPIRAMATE (toe PYRE a mate) is used to treat seizures in adults or children with epilepsy. This medicine may be used for other purposes; ask your health care provider or pharmacist if you have questions. What should I tell my health care provider before I take this medicine? They need to know if you have any of these conditions: -cirrhosis of the liver or liver disease -diarrhea -glaucoma -kidney stones or kidney disease -lung disease like asthma, obstructive pulmonary disease, emphysema -metabolic acidosis -on a ketogenic diet -scheduled for surgery or a procedure -suicidal thoughts, plans, or attempt; a previous suicide attempt by you or a family member -an unusual or allergic reaction to topiramate, other medicines, foods, dyes, or preservatives -pregnant or trying to get pregnant -breast-feeding How should I use this medicine? Take this medicine by mouth with a glass of water. Follow the directions on the prescription label. Trokendi XR capsules must be swallowed whole. Do not sprinkle on food, break, crush, dissolve, or chew. Qudexy XR capsules may be swallowed whole or opened and sprinkled on a small amount of soft food. This mixture must be swallowed immediately. Do not chew or store mixture for later use. You may take this medicine with meals. Take  your medicine at regular intervals. Do not take it more often than directed. Talk to your pediatrician regarding the use of this medicine in children. Special care may be needed. While Trokendi XR may be prescribed for children as young as 6 years and Qudexy XR may be prescribed for children as young as 2 years for selected conditions, precautions do apply. Overdosage: If you think you have taken too much of this medicine contact a poison control center or  emergency room at once. NOTE: This medicine is only for you. Do not share this medicine with others. What if I miss a dose? If you miss a dose, take it as soon as you can. If it is almost time for your next dose, take only that dose. Do not take double or extra doses. What may interact with this medicine? Do not take this medicine with any of the following medications: -probenecid This medicine may also interact with the following medications: -acetazolamide -alcohol -amitriptyline -birth control pills -digoxin -hydrochlorothiazide -lithium -medicines for pain, sleep, or muscle relaxation -metformin -methazolamide -other seizure or epilepsy medicines -pioglitazone -risperidone This list may not describe all possible interactions. Give your health care provider a list of all the medicines, herbs, non-prescription drugs, or dietary supplements you use. Also tell them if you smoke, drink alcohol, or use illegal drugs. Some items may interact with your medicine. What should I watch for while using this medicine? Visit your doctor or health care professional for regular checks on your progress. Do not stop taking this medicine suddenly. This increases the risk of seizures if you are using this medicine to control epilepsy. Wear a medical identification bracelet or chain to say you have epilepsy or seizures, and carry a card that lists all your medicines. This medicine can decrease sweating and increase your body temperature. Watch for signs of deceased sweating or fever, especially in children. Avoid extreme heat, hot baths, and saunas. Be careful about exercising, especially in hot weather. Contact your health care provider right away if you notice a fever or decrease in sweating. You should drink plenty of fluids while taking this medicine. If you have had kidney stones in the past, this will help to reduce your chances of forming kidney stones. If you have stomach pain, with nausea or  vomiting and yellowing of your eyes or skin, call your doctor immediately. You may get drowsy, dizzy, or have blurred vision. Do not drive, use machinery, or do anything that needs mental alertness until you know how this medicine affects you. To reduce dizziness, do not sit or stand up quickly, especially if you are an older patient. Alcohol can increase drowsiness and dizziness. Avoid alcoholic drinks. Do not drink alcohol for 6 hours before or 6 hours after taking Trokendi XR. If you notice blurred vision, eye pain, or other eye problems, seek medical attention at once for an eye exam. The use of this medicine may increase the chance of suicidal thoughts or actions. Pay special attention to how you are responding while on this medicine. Any worsening of mood, or thoughts of suicide or dying should be reported to your health care professional right away. This medicine may increase the chance of developing metabolic acidosis. If left untreated, this can cause kidney stones, bone disease, or slowed growth in children. Symptoms include breathing fast, fatigue, loss of appetite, irregular heartbeat, or loss of consciousness. Call your doctor immediately if you experience any of these side effects. Also, tell your doctor about any surgery you  plan on having while taking this medicine since this may increase your risk for metabolic acidosis. Birth control pills may not work properly while you are taking this medicine. Talk to your doctor about using an extra method of birth control. Women who become pregnant while using this medicine may enroll in the [place] Pregnancy Registry by calling [phone]. This registry collects information about the safety of antiepileptic drug use during pregnancy. What side effects may I notice from receiving this medicine? Side effects that you should report to your doctor or health care professional as soon as possible: -allergic reactions like skin  rash, itching or hives, swelling of the face, lips, or tongue -decreased sweating and/or rise in body temperature -depression -difficulty breathing, fast or irregular breathing patterns -difficulty speaking -difficulty walking or controlling muscle movements -hearing impairment -redness, blistering, peeling or loosening of the skin, including inside the mouth -tingling, pain or numbness in the hands or feet -unusually weak or tired -worsening of mood, thoughts or actions of suicide or dying Side effects that usually do not require medical attention (Report these to your doctor or health care professional if they continue or are bothersome.): -altered taste -back pain, joint or muscle aches and pains -diarrhea, or constipation -headache -loss of appetite -nausea -stomach upset, indigestion -tremors This list may not describe all possible side effects. Call your doctor for medical advice about side effects. You may report side effects to FDA at 1-800-FDA-1088. Where should I keep my medicine? Keep out of the reach of children. Store at room temperature between 15 and 30 degrees C (59 and 86 degrees F) in a tightly closed container. Protect from moisture. Throw away any unused medicine after the expiration date. NOTE: This sheet is a summary. It may not cover all possible information. If you have questions about this medicine, talk to your doctor, pharmacist, or health care provider.    2016, Elsevier/Gold Standard. (2012-07-17 15:33:26)  Promethazine tablets What is this medicine? PROMETHAZINE (proe METH a zeen) is an antihistamine. It is used to treat allergic reactions and to treat or prevent nausea and vomiting from illness or motion sickness. It is also used to make you sleep before surgery, and to help treat pain or nausea after surgery. This medicine may be used for other purposes; ask your health care provider or pharmacist if you have questions. What should I tell my health  care provider before I take this medicine? They need to know if you have any of these conditions: -glaucoma -high blood pressure or heart disease -kidney disease -liver disease -lung or breathing disease, like asthma -prostate trouble -pain or difficulty passing urine -seizures -an unusual or allergic reaction to promethazine or phenothiazines, other medicines, foods, dyes, or preservatives -pregnant or trying to get pregnant -breast-feeding How should I use this medicine? Take this medicine by mouth with a glass of water. Follow the directions on the prescription label. Take your doses at regular intervals. Do not take your medicine more often than directed. Talk to your pediatrician regarding the use of this medicine in children. Special care may be needed. This medicine should not be given to infants and children younger than 64 years old. Overdosage: If you think you have taken too much of this medicine contact a poison control center or emergency room at once. NOTE: This medicine is only for you. Do not share this medicine with others. What if I miss a dose? If you miss a dose, take it as soon as  you can. If it is almost time for your next dose, take only that dose. Do not take double or extra doses. What may interact with this medicine? Do not take this medicine with any of the following medications: -cisapride -dofetilide -dronedarone -MAOIs like Carbex, Eldepryl, Marplan, Nardil, Parnate -pimozide -quinidine, including dextromethorphan; quinidine -thioridazine -ziprasidone This medicine may also interact with the following medications: -certain medicines for depression, anxiety, or psychotic disturbances -certain medicines for anxiety or sleep -certain medicines for seizures like carbamazepine, phenobarbital, phenytoin -certain medicines for movement abnormalities as in Parkinson's disease, or for gastrointestinal problems -epinephrine -medicines for allergies or  colds -muscle relaxants -narcotic medicines for pain -other medicines that prolong the QT interval (cause an abnormal heart rhythm) -tramadol -trimethobenzamide This list may not describe all possible interactions. Give your health care provider a list of all the medicines, herbs, non-prescription drugs, or dietary supplements you use. Also tell them if you smoke, drink alcohol, or use illegal drugs. Some items may interact with your medicine. What should I watch for while using this medicine? Tell your doctor or health care professional if your symptoms do not start to get better in 1 to 2 days. You may get drowsy or dizzy. Do not drive, use machinery, or do anything that needs mental alertness until you know how this medicine affects you. To reduce the risk of dizzy or fainting spells, do not stand or sit up quickly, especially if you are an older patient. Alcohol may increase dizziness and drowsiness. Avoid alcoholic drinks. Your mouth may get dry. Chewing sugarless gum or sucking hard candy, and drinking plenty of water may help. Contact your doctor if the problem does not go away or is severe. This medicine may cause dry eyes and blurred vision. If you wear contact lenses you may feel some discomfort. Lubricating drops may help. See your eye doctor if the problem does not go away or is severe. This medicine can make you more sensitive to the sun. Keep out of the sun. If you cannot avoid being in the sun, wear protective clothing and use sunscreen. Do not use sun lamps or tanning beds/booths. If you are diabetic, check your blood-sugar levels regularly. What side effects may I notice from receiving this medicine? Side effects that you should report to your doctor or health care professional as soon as possible: -blurred vision -irregular heartbeat, palpitations or chest pain -muscle or facial twitches -pain or difficulty passing urine -seizures -skin rash -slowed or shallow  breathing -unusual bleeding or bruising -yellowing of the eyes or skin Side effects that usually do not require medical attention (report to your doctor or health care professional if they continue or are bothersome): -headache -nightmares, agitation, nervousness, excitability, not able to sleep (these are more likely in children) -stuffy nose This list may not describe all possible side effects. Call your doctor for medical advice about side effects. You may report side effects to FDA at 1-800-FDA-1088. Where should I keep my medicine? Keep out of the reach of children. Store at room temperature, between 20 and 25 degrees C (68 and 77 degrees F). Protect from light. Throw away any unused medicine after the expiration date. NOTE: This sheet is a summary. It may not cover all possible information. If you have questions about this medicine, talk to your doctor, pharmacist, or health care provider.    2016, Elsevier/Gold Standard. (2012-12-18 15:04:46)

## 2016-02-08 ENCOUNTER — Telehealth: Payer: Self-pay | Admitting: Neurology

## 2016-02-08 NOTE — Telephone Encounter (Signed)
Patient has been approved for Doppler approval # [id number]  Jan 31 2016- November  1 , 2017 . Marland KitchenThanks [employer]

## 2016-02-11 ENCOUNTER — Ambulatory Visit (INDEPENDENT_AMBULATORY_CARE_PROVIDER_SITE_OTHER): Payer: Medicaid Other

## 2016-02-11 ENCOUNTER — Telehealth: Payer: Self-pay

## 2016-02-11 DIAGNOSIS — H832X3 Labyrinthine dysfunction, bilateral: Secondary | ICD-10-CM

## 2016-02-11 DIAGNOSIS — G450 Vertebro-basilar artery syndrome: Secondary | ICD-10-CM

## 2016-02-11 DIAGNOSIS — E049 Nontoxic goiter, unspecified: Secondary | ICD-10-CM

## 2016-02-11 DIAGNOSIS — G43011 Migraine without aura, intractable, with status migrainosus: Secondary | ICD-10-CM

## 2016-02-11 DIAGNOSIS — Z0289 Encounter for other administrative examinations: Secondary | ICD-10-CM

## 2016-02-11 NOTE — Telephone Encounter (Signed)
Pt in office for bubble study. Placed 22G IV in Left AC x1 attempt. Cleaned with chlorhexidine prior to insertion. +blood return and flushes without resistance. Secured with tegaderm and tape. Pt tolerated well. No complaint of pain upon flushing IV. No visible signs of swelling or bruising. Site clean/dry/intact. Placed IV at 10:25.

## 2016-02-17 ENCOUNTER — Telehealth: Payer: Self-pay

## 2016-02-17 NOTE — Telephone Encounter (Signed)
I sent a copy of pt's carotid doppler study signed by Dr. Brett Fairy to [facility] to be scanned into EPIC.

## 2016-02-17 NOTE — Telephone Encounter (Signed)
I used the interpreter service, [place] interpreter [place], [phone]. I called pt to advise her that her carotid doppler study was normal (per Dr. Brett Fairy.) Called the number listed [phone]). This number is not available. I called the number listed on pt's DPR ([phone]). A man answered, spoke to the interpreter, and said that he is a family member of the pt, however she is not at home. The name of this man did not match the names on pt's DPR, therefore, I cannot release information to him. He said that he couldn't ask the pt to call back because he can't write down our number.  I will send a letter to pt's home address asking [place] to call us back. I spent greater than 15 minutes on the phone with the interpreter service trying to reach pt via phone.

## 2016-03-01 ENCOUNTER — Encounter: Payer: Self-pay | Admitting: Internal Medicine

## 2016-03-01 ENCOUNTER — Ambulatory Visit (INDEPENDENT_AMBULATORY_CARE_PROVIDER_SITE_OTHER): Payer: Medicaid Other | Admitting: Family Medicine

## 2016-03-01 ENCOUNTER — Ambulatory Visit (INDEPENDENT_AMBULATORY_CARE_PROVIDER_SITE_OTHER): Payer: Medicaid Other | Admitting: Internal Medicine

## 2016-03-01 VITALS — BP 156/66 | HR 104 | Temp 98.3°F | Ht 64.0 in | Wt 180.0 lb

## 2016-03-01 VITALS — BP 158/83 | HR 83 | Ht 63.5 in | Wt 181.0 lb

## 2016-03-01 DIAGNOSIS — Z23 Encounter for immunization: Secondary | ICD-10-CM | POA: Diagnosis not present

## 2016-03-01 DIAGNOSIS — R4182 Altered mental status, unspecified: Secondary | ICD-10-CM

## 2016-03-01 DIAGNOSIS — B182 Chronic viral hepatitis C: Secondary | ICD-10-CM | POA: Diagnosis not present

## 2016-03-01 DIAGNOSIS — G43011 Migraine without aura, intractable, with status migrainosus: Secondary | ICD-10-CM | POA: Diagnosis not present

## 2016-03-01 MED ORDER — GLECAPREVIR-PIBRENTASVIR 100-40 MG PO TABS: 3.0000 | ORAL_TABLET | Freq: Every day | ORAL | 2 refills | Status: DC

## 2016-03-01 MED ORDER — TOPIRAMATE 25 MG PO TABS: 25.0000 mg | ORAL_TABLET | Freq: Two times a day (BID) | ORAL | 0 refills | Status: DC

## 2016-03-01 MED ORDER — IBUPROFEN 600 MG PO TABS: 600.0000 mg | ORAL_TABLET | Freq: Three times a day (TID) | ORAL | 0 refills | Status: DC | PRN

## 2016-03-01 MED ORDER — KETOROLAC TROMETHAMINE 30 MG/ML IJ SOLN
30.0000 mg | Freq: Once | INTRAMUSCULAR | Status: AC
  Administered 2016-03-01: 30 mg via INTRAMUSCULAR

## 2016-03-01 NOTE — Patient Instructions (Addendum)
It was good to see you today!  Take the Topiramate twice daily.  Take the ibuprofen for pain relief.  Come back to see me in 2-3 weeks

## 2016-03-01 NOTE — Progress Notes (Signed)
Subjective:    Stephanie Swanson is a 60 y.o. female who presents to [facility] today for FU for altered mental status since leaving the hospital.  Since then, she has been having recurrent headaches.  Daughter present for visit.  [employer] interpreter used:  1.  Headaches and shaking:  Present since before and after recent hospitalization. She has followed up with neurology. She is being treated for atypical migraines. She was put on Topamax 25 mg twice a day. Since that she is not taking this at home. She is also not taking anything when necessary for pain relief. Daughter and patient described pain almost every day.  She's also had some episodes of confusion and "shaking." Daughter states that her memory has gradually deteriorated since coming to [place]. She has trouble remembering the date or her birthdate. Daughter states that patient showed just sits around at home. Complaining of headache. No vision changes. Questionable associated nausea. No vomiting. Daughter states patient will eat but otherwise does not demonstrate the day.  No fevers or chills.  Seen earlier today at [facility] clinic by Dr. Linus Salmons.  Doing well from this standpoint.   ROS as above per HPI.   The following portions of the patient's history were reviewed and updated as appropriate: allergies, current medications, past medical history, family and social history, and problem list. Patient is a nonsmoker.    PMH reviewed.  Past Medical History:  Diagnosis Date  . Headache   . Hypertension    No past surgical history on file.  Medications reviewed. Current Outpatient Prescriptions  Medication Sig Dispense Refill  . Glecaprevir-Pibrentasvir (MAVYRET) 100-40 MG TABS Take 3 tablets by mouth daily. 90 tablet 2   No current facility-administered medications for this visit.      Objective:   Physical Exam There were no vitals taken for this visit. Gen:  Alert, cooperative patient who appears stated age in  no acute distress.  Vital signs reviewed.  She is sitting in chair by the examination table. She is slow to respond to my questions but will respond appropriately to her daughter's questions. HEENT: EOMI, PERRL.   MMM.   Cardiac:  Regular rate and rhythm  Pulm:  Clear to auscultation bilaterally  Abd:  Soft/nondistended/nontender.  Exts: Non edematous BL  LE, warm and well perfused.  Neuro:  She is able to state her name as well as her daughter's name. However she cannot remember her birthdate. No focal muscular or sensory deficits throughout.  No results found for this or any previous visit (from the past 72 hour(s)).

## 2016-03-01 NOTE — Patient Instructions (Signed)
Date 03/01/16  Dear Mrs Bucknam, As discussed in the [facility], your hepatitis C therapy will include the following medications:          Mavyret (glecaprevir 100 mg/pibrentasvir 40 mg): Take 3 tablets by mouth once daily for 8 or 12 weeks ---------------------------------------------------------------- Your HCV Treatment Start Date: TBA   Your HCV genotype:  4    Liver Fibrosis: TBD   ---------------------------------------------------------------- YOUR PHARMACY CONTACT:   [facility] [place] Phone: [phone] Hours: Monday to Friday 7:30 am to 6:00 pm   Please always contact your pharmacy at least 3-4 business days before you run out of medications to ensure your next month's medication is ready or 1 week prior to running out if you receive it by mail.  Remember, each prescription is for 28 days. ---------------------------------------------------------------- GENERAL NOTES REGARDING YOUR HEPATITIS C MEDICATION:  Mavyret: - tablets are pink, oblong shape - take 3 tablets daily with food. - The tablets should be stored at room temperature.  - Acid reducing agents such as H2 blockers (ie. Pepcid (famotidine), Zantac (ranitidine), Tagamet (cimetidine), Axid (nizatidine) and proton pump inhibitors (ie. Prilosec (omeprazole), Protonix (pantoprazole), Nexium (esomeprazole), or Aciphex (rabeprazole)) can decrease effectiveness of Harvoni. Do not take until you have discussed with a health care provider.    -Antacids that contain magnesium and/or aluminum hydroxide (ie. Milk of Magensia, Rolaids, Gaviscon, Maalox, Mylanta, an dArthritis Pain Formula) can reduce absorption of Harvoni, so take them at least 4 hours before or after Harvoni.  -Calcium carbonate (calcium supplements or antacids such as Tums, Caltrate, Os-Cal) needs to be taken at least 4 hours hours before or after Harvoni.  -St. John's wort or  any products that contain St. John's wort like some herbal supplements  Please inform the office prior to starting any of these medications.  - The common side effects associated with Mavyret include:      1. Fatigue      2. Headache      3. Nausea      4. Diarrhea      5. Insomnia  Please note that this only lists the most common side effects and is NOT a comprehensive list of the potential side effects of these medications. For more information, please review the drug information sheets that come with your medication package from the pharmacy.  ---------------------------------------------------------------- GENERAL HELPFUL HINTS ON HCV THERAPY: 1. Stay well-hydrated. 2. Notify the [facility] of any changes in your other over-the-counter/herbal or prescription medications. 3. If you miss a dose of your medication, take the missed dose as soon as you remember. Return to your regular time/dose schedule the next day.  4.  Do not stop taking your medications without first talking with your healthcare provider. 5.  You may take Tylenol (acetaminophen), as long as the dose is less than 2000 mg (OR no more than 4 tablets of the Tylenol Extra Strengths 500mg  tablet) in 24 hours. 6.  You will see our pharmacist-specialist within the first 2 weeks of starting your medication to monitor for any possible side effects. 7.  You will have labs once during treatment, after soon after treatment completion and one final lab 6 months after treatment completion to verify the virus is out of your system.  Scharlene Gloss, [place] for [place] [address] [place], [place]  [id number] [phone]

## 2016-03-01 NOTE — Progress Notes (Signed)
[facility]   CC: consideration for treatment for chronic hepatitis C  HPI:  +Stephanie Swanson is a 60 y.o. female who presents for initial evaluation and management of chronic hepatitis C.  Patient tested positive during recent hospitalization. Hepatitis C-associated risk factors present are: none. Patient denies intranasal drug use, IV drug abuse, multiple sexual partners, renal dialysis, sexual contact with person with liver disease, tattoos. Patient has had other studies performed. Results: hepatitis C RNA by PCR, result: positive. Patient has not had prior treatment for Hepatitis C. Patient does not have a past history of liver disease. Patient does not have a family history of liver disease. Patient does not  have associated signs or symptoms related to liver disease.  Labs reviewed and confirm chronic hepatitis C with a positive viral load.   Records reviewed from recent hospitalization.  She has had some altered mental status.       Patient does not have documented immunity to Hepatitis A. Patient does not have documented immunity to Hepatitis B.    Review of Systems:   Constitutional: negative for fatigue and malaise Gastrointestinal: negative for diarrhea Musculoskeletal: negative for myalgias and arthralgias All other systems reviewed and are negative      Past Medical History:  Diagnosis Date  . Headache   . Hypertension     Prior to Admission medications   Medication Sig Start Date End Date Taking? Authorizing Provider  Glecaprevir-Pibrentasvir (MAVYRET) 100-40 MG TABS Take 3 tablets by mouth daily. 03/01/16   Stephanie Headings, MD    No Known Allergies  Social History  Substance Use Topics  . Smoking status: Never Smoker  . Smokeless tobacco: Never Used  . Alcohol use No    FMHx: no known cirrhosis or liver cancer   Objective:  Constitutional: in no apparent distress,  Vitals:   03/01/16 1114  BP: (!) 158/83  Pulse: 83    Eyes: anicteric Cardiovascular: Cor RRR Respiratory: CTA B; normal respiratory effort Gastrointestinal: Bowel sounds are normal, liver is not enlarged, spleen is not enlarged Musculoskeletal: no pedal edema noted Skin: negatives: no rash; no porphyria cutanea tarda Lymphatic: no cervical lymphadenopathy   Laboratory Genotype: No results found for: HCVGENOTYPE HCV viral load: No results found for: HCVQUANT Lab Results  Component Value Date   WBC 7.3 12/14/2015   HGB 13.8 12/14/2015   HCT 41.9 12/14/2015   MCV 91.9 12/14/2015   PLT 183 12/14/2015    Lab Results  Component Value Date   CREATININE 0.58 12/15/2015   BUN <5 (L) 12/15/2015   NA 139 12/15/2015   K 3.8 12/21/2015   CL 107 12/15/2015   CO2 23 12/15/2015    Lab Results  Component Value Date   ALT 38 12/15/2015   AST 50 (H) 12/15/2015   ALKPHOS 89 12/15/2015     Labs and history reviewed and show CHILD-PUGH A  5-6 points: Child class A 7-9 points: Child class B 10-15 points: Child class C  Lab Results  Component Value Date   INR 1.18 12/12/2015   BILITOT 1.1 12/15/2015   ALBUMIN 3.1 (L) 12/15/2015     Assessment: New Patient with Chronic Hepatitis C genotype 4, untreated.  I discussed with the patient the lab findings that confirm chronic hepatitis C as well as the natural history and progression of disease including about 30% of people who develop cirrhosis of the liver if left untreated and once cirrhosis is established there is a 2-7% risk  per year of liver cancer and liver failure.  I discussed the importance of treatment and benefits in reducing the risk, even if significant liver fibrosis exists.   Plan: 1) Patient counseled extensively on limiting acetaminophen to no more than 2 grams daily, avoidance of alcohol. 2) Transmission discussed with patient including sexual transmission, sharing razors and toothbrush.   3) Will need referral to gastroenterology if concern for cirrhosis 4) Will need  referral for substance abuse counseling: No.; Further work up to include urine drug screen  No. 5) Will prescribe Mavyret for 12 weeks 6) Hepatitis A vaccine No. 7) Hepatitis B vaccine No.will check titers today 8) Pneumovax vaccine if concern for cirrhosis 9) Further work up to include liver staging with elastography 10) will follow up after starting medication.  She has had memory issues and difficulty understanding even with her daughter translating and has had significant confusion, dizziness and multiple issues.  She was seen already by Dr. Brett Fairy and work up continuing.  Hepatitis C could be partially related though I would doubt much.  Treatment though could help some.   She is seeing her PCP today.

## 2016-03-02 LAB — HEPATITIS B CORE ANTIBODY, TOTAL: HEP B C TOTAL AB: REACTIVE — AB

## 2016-03-02 LAB — HEPATITIS B SURFACE ANTIGEN: HEP B S AG: NEGATIVE

## 2016-03-02 LAB — HEPATITIS A ANTIBODY, TOTAL: HEP A TOTAL AB: REACTIVE — AB

## 2016-03-02 LAB — HEPATITIS B SURFACE ANTIBODY,QUALITATIVE: HEP B S AB: NEGATIVE

## 2016-03-02 NOTE — Assessment & Plan Note (Signed)
She is not currently taking any preventative medicines nor treatment medicines for this.  Doesn't sound like she ever took her Topamax either. -I have printed this out and provided for her. -Also providing her with ibuprofen for when necessary treatment of headaches. -She does have very atypical symptoms. -See below for memory issues

## 2016-03-02 NOTE — Assessment & Plan Note (Signed)
Better -- this still with fairly persistent memory issues. -Possibility of new onset versus no uncovering of dementia. -Could also be pseudodementia from depression or ongoing pain. -She cannot remember today's date. She cannot remember her birth date.  However in a refugee patient whose birthday is assigned as [birth year], this generally means she did not know the date of her birth and has no medical records of her birth. So this may be less helpful in determining orientation questions. Sounds like she can complete her ADLs but may struggle with iADLs

## 2016-03-04 LAB — LIVER FIBROSIS, FIBROTEST-ACTITEST
ALPHA-2-MACROGLOBULIN: 357 mg/dL — AB (ref 106–279)
ALT: 33 U/L — AB (ref 6–29)
APOLIPOPROTEIN A1: 133 mg/dL (ref 101–198)
Bilirubin: 0.5 mg/dL (ref 0.2–1.2)
FIBROSIS SCORE: 0.78
GGT: 179 U/L — AB (ref 3–65)
Haptoglobin: 62 mg/dL (ref 43–212)
Necroinflammat ACT Score: 0.29
Reference ID: 1676834

## 2016-03-15 ENCOUNTER — Encounter: Payer: Self-pay | Admitting: Pharmacy Technician

## 2016-03-22 ENCOUNTER — Ambulatory Visit (INDEPENDENT_AMBULATORY_CARE_PROVIDER_SITE_OTHER): Payer: Medicaid Other | Admitting: Family Medicine

## 2016-03-22 ENCOUNTER — Ambulatory Visit: Payer: Medicaid Other | Admitting: Family Medicine

## 2016-03-22 ENCOUNTER — Encounter: Payer: Self-pay | Admitting: Family Medicine

## 2016-03-22 VITALS — BP 169/76 | HR 85 | Temp 97.9°F | Ht 64.0 in | Wt 182.8 lb

## 2016-03-22 DIAGNOSIS — R030 Elevated blood-pressure reading, without diagnosis of hypertension: Secondary | ICD-10-CM | POA: Diagnosis not present

## 2016-03-22 DIAGNOSIS — M545 Low back pain: Secondary | ICD-10-CM

## 2016-03-22 DIAGNOSIS — G8929 Other chronic pain: Secondary | ICD-10-CM | POA: Diagnosis not present

## 2016-03-22 DIAGNOSIS — R1013 Epigastric pain: Secondary | ICD-10-CM

## 2016-03-22 DIAGNOSIS — G43011 Migraine without aura, intractable, with status migrainosus: Secondary | ICD-10-CM

## 2016-03-22 DIAGNOSIS — M549 Dorsalgia, unspecified: Secondary | ICD-10-CM

## 2016-03-22 MED ORDER — TRAMADOL HCL 50 MG PO TABS: 50.0000 mg | ORAL_TABLET | Freq: Three times a day (TID) | ORAL | 0 refills | Status: DC | PRN

## 2016-03-22 MED ORDER — AMOXICILL-CLARITHRO-LANSOPRAZ PO MISC: Freq: Two times a day (BID) | ORAL | 0 refills | Status: DC

## 2016-03-22 MED ORDER — TOPIRAMATE 50 MG PO TABS: 50.0000 mg | ORAL_TABLET | Freq: Two times a day (BID) | ORAL | 1 refills | Status: DC

## 2016-03-22 NOTE — Progress Notes (Signed)
Subjective:   Daughter and Therapist, nutritional Josias from [facility] present today for interview:  Stephanie Swanson is a 60 y.o. female who presents to [facility] today for FU for headaches:  1.  Headaches:   She states this is better with the Topamax.  Dizziness has also improved.  Taking this twice daily and has been since last visit.  She does experience some scalp itching but states this is controllable and she is much happy that her headaches under control and would not like to stop the Topamax.  #2. Chronic epigastric pain: She previously was diagnosed with H. pylori. However after talking with both her and her patient it sounds like she never picked this up. Worse after certain meals including spicy foods. No nausea vomiting. She's had no weight loss. This is been long-standing for years.  3. Chronic back pain: Bilateral lumbar pain which is also been chronic. She previously was taking NSAIDs we stopped these because of her epigastric pain.  She had been prescribed tramadol for relief of her back pain the past but has not picked this up either. No radiation to bilateral legs. She has a homemade brace which is just a rope tied around her abdomen and back which provides her some support. She is asking if there is something similar to this which might provide her some relief. No bowel or bladder incontinence.  ROS as above per HPI.    The following portions of the patient's history were reviewed and updated as appropriate: allergies, current medications, past medical history, family and social history, and problem list. Patient is a nonsmoker.    PMH reviewed.  Past Medical History:  Diagnosis Date  . Headache   . Hypertension    No past surgical history on file.  Medications reviewed. Current Outpatient Prescriptions  Medication Sig Dispense Refill  . Glecaprevir-Pibrentasvir (MAVYRET) 100-40 MG TABS Take 3 tablets by mouth daily. 90 tablet 2  . ibuprofen (ADVIL,MOTRIN) 600 MG tablet  Take 1 tablet (600 mg total) by mouth every 8 (eight) hours as needed. 30 tablet 0  . topiramate (TOPAMAX) 25 MG tablet Take 1 tablet (25 mg total) by mouth 2 (two) times daily. 60 tablet 0   No current facility-administered medications for this visit.      Objective:   Physical Exam BP (!) 169/76   Pulse 85   Temp 97.9 F (36.6 C) (Oral)   Ht 5\' 4"  (1.626 m)   Wt 182 lb 12.8 oz (82.9 kg)   BMI 31.38 kg/m  Gen:  Alert, cooperative patient who appears stated age in no acute distress.  Vital signs reviewed. HEENT: EOMI,  MMM Cardiac:  Regular rate and rhythm  Pulm:  Clear to auscultation bilaterally with good air movement.   Abd:  Minimal epigastric tenderness noted on exam. Otherwise abdomen is soft and nondistended. She is wearing a rope as a belt tied around her abdomen  Exts: Non edematous BL  LE, warm and well perfused.  Back:  Normal skin, Spine with normal alignment and no deformity.  No tenderness to vertebral process palpation.  Paraspinous muscles are tender in the lumbar region on the right side with mild spasm..   Range of motion is full at neck   Neuro:  Sensation and motor function 5/5 bilateral lower extremities.

## 2016-03-22 NOTE — Patient Instructions (Signed)
Take the tramadol at night if you are having pain. This is for back pain.  Take the Topamax as you have been twice a day once in the morning and once in the evening. This is for dizziness and headache.  Take the lansoprazole packet which are several different medicines twice a day as directed. This is for your stomach.  Come back and see me in 3-4 weeks

## 2016-03-23 DIAGNOSIS — M545 Low back pain, unspecified: Secondary | ICD-10-CM | POA: Insufficient documentation

## 2016-03-23 NOTE — Assessment & Plan Note (Signed)
Better with Topamax. However she is still on low dose and not quite to recommended dose for migraine prevention/treatment. Increasing to 50 mg twice a day today. Follow-up in 3-4 weeks for reassessment.

## 2016-03-23 NOTE — Assessment & Plan Note (Signed)
This is been long-standing issue for the patient with past several years. Sending her for lumbar films today she's never had these. Never treated will she was in [place] She was previously taking NSAIDs. There seems a been a miscommunication. She stopped her NSAIDs but did not start anything for pain relief. Absent and tramadol again for her today.

## 2016-03-23 NOTE — Assessment & Plan Note (Signed)
She has had persistent elevation of her blood pressure. We are starting multiple medications today. Did not want to confuse her or her daughter and we will discuss this as starting treatment of hypertension in the next visit.

## 2016-03-23 NOTE — Assessment & Plan Note (Signed)
With diagnosis of H pyloric which was never treated as family never picked up Prevpac. I sent this in again today. Follow-up for reassessment in 3-4 weeks.

## 2016-03-28 ENCOUNTER — Telehealth: Payer: Self-pay | Admitting: Family Medicine

## 2016-03-28 NOTE — Telephone Encounter (Signed)
Patient's daughter came asking for Disability forms she gave to PCP during last appointment on 11-21. Ms. Jun Reifsnyder said PCP called and told her to come today after 9:00 to pick the form. Document was not found at [employer] Please, follow up.

## 2016-03-28 NOTE — Telephone Encounter (Signed)
Pt daughter informed of the below message. Katharina Caper, April D, [place]

## 2016-03-28 NOTE — Telephone Encounter (Signed)
I have placed these up front for patient's daughter to pick up.  Please apologize for me!!!  JW

## 2016-03-29 ENCOUNTER — Ambulatory Visit: Payer: Medicaid Other | Admitting: Adult Health

## 2016-03-31 ENCOUNTER — Encounter: Payer: Self-pay | Admitting: Adult Health

## 2016-03-31 ENCOUNTER — Ambulatory Visit (INDEPENDENT_AMBULATORY_CARE_PROVIDER_SITE_OTHER): Payer: Medicaid Other | Admitting: Adult Health

## 2016-03-31 VITALS — BP 146/82 | HR 85 | Wt 183.0 lb

## 2016-03-31 DIAGNOSIS — G43019 Migraine without aura, intractable, without status migrainosus: Secondary | ICD-10-CM

## 2016-03-31 MED ORDER — TOPIRAMATE 50 MG PO TABS: 75.0000 mg | ORAL_TABLET | Freq: Two times a day (BID) | ORAL | 3 refills | Status: DC

## 2016-03-31 NOTE — Telephone Encounter (Signed)
Pt arrived to her appt today with Jinny Blossom, NP. I advised her that the carotid doppler study was normal, per Dr. Brett Fairy. Pt was with daughter and a rwand[place] interpeter and they verbalized understanding.

## 2016-03-31 NOTE — Patient Instructions (Signed)
Increase topamax 50 mg to 1.5 tablets twice a day If your symptoms worsen or you develop new symptoms please let us know.

## 2016-03-31 NOTE — Progress Notes (Signed)
PATIENT: Stephanie Swanson DOB: November 24, 1955  REASON FOR VISIT: follow up- headaches HISTORY FROM: patient  HISTORY OF PRESENT ILLNESS: Stephanie Swanson is a 60 year old female with a history of intractable headaches. She returns today for follow-up. She is here today with an interpreter. However her daughter has not arrived for the visit yet. The patient is not aware of any medication that she is taking. She states that she continues to have headaches. The headaches typically occur on the top of the head extending down to the temples and across the maxillary sinuses. The patient denies having any symptoms related to her sinuses. She's never seen a ENT. She states that her dizziness did get slightly better with Topamax. She does have some light and noise sensitivity with her headaches. States that she has a headache daily. She had carotid Dopplers which were normal. She returns today for an evaluation.  HISTORY 01/27/16: Stephanie Swanson is a 57 y.o.  [place] female is seen here as a referral from Dr. Erlinda Hong, [facility] hospitalist for the [facility]. A translator and her daughter are present.  The patient presented to the emergency room in a state of decreased alertness, admitted on 12/12/2015 due to responsiveness only to pain, she was brought in by ambulance after her family had found her on the couch not responding to verbal stimuli or touching. After she presented to the emergency room she appeared to improve continuously and responded to verbal commands. Her family members translated in the emergency room and she was able to move all 4 extremities. She was worked up for possible stroke but her MRI was negative and his stroke workup came back negative. She reported that she had a severe headache. She is not using any herbal medicines, illicit drugs she does not drink alcohol, she has no history of a seizure disorder, she was tested for ammonia levels, and these were mildly  elevated, vitamin B12 level was within normal limits, thiamine was normal, thyroid-stimulating hormone was normal, an HIV test was negative, RPR was negative, and sedimentation rate was within normal limits. She had a urinary tract infection with colonies of Streptococcus viridans. He was given amoxicillin for 5 days. On August 15 she was discharged after she passed a swallow test EEG was normal, neurology consult stated that outpatient follow-up with Dr. Mingo Amber, primary care physician, is recommended. There also recommended that she start low dose amitriptyline at night for migraine treatment and prophylaxis. She was recommended to see PCP within a week, but hasn't. She was told to see infectious disease,  had blood drawn..   Her daughter reports that her mother was again unsteady on Sunday and fell after the church service. She is having dizziness. She described a spinning sensation, severe vertigo.  She recalls having a similar sensation before developing severe headaches and then amnesia on the day that she presented to the emergency department.  REVIEW OF SYSTEMS: Out of a complete 14 system review of symptoms, the patient complains only of the following symptoms, and all other reviewed systems are negative.  Memory loss, dizziness, headache, seizures, weakness, passing out, eye pain, fatigue, chills, fever  ALLERGIES: No Known Allergies  HOME MEDICATIONS: Outpatient Medications Prior to Visit  Medication Sig Dispense Refill  . amoxicillin-clarithromycin-lansoprazole (PREVPAC) combo pack Take by mouth 2 (two) times daily. Follow package directions. 1 kit 0  . Glecaprevir-Pibrentasvir (MAVYRET) 100-40 MG TABS Take 3 tablets by mouth daily. 90 tablet 2  . ibuprofen (ADVIL,MOTRIN) 600 MG tablet  Take 1 tablet (600 mg total) by mouth every 8 (eight) hours as needed. 30 tablet 0  . traMADol (ULTRAM) 50 MG tablet Take 1 tablet (50 mg total) by mouth every 8 (eight) hours as needed. 30 tablet 0  .  topiramate (TOPAMAX) 50 MG tablet Take 1 tablet (50 mg total) by mouth 2 (two) times daily. 60 tablet 1   No facility-administered medications prior to visit.     PAST MEDICAL HISTORY: Past Medical History:  Diagnosis Date  . Headache   . Hypertension     PAST SURGICAL HISTORY: History reviewed. No pertinent surgical history.  FAMILY HISTORY: History reviewed. No pertinent family history.  SOCIAL HISTORY: Social History   Social History  . Marital status: Single    Spouse name: N/A  . Number of children: 4  . Years of education: 0   Occupational History  . Not on file.   Social History Main Topics  . Smoking status: Never Smoker  . Smokeless tobacco: Never Used  . Alcohol use No  . Drug use: No  . Sexual activity: Not on file   Other Topics Concern  . Not on file   Social History Narrative   Immigrant Social History:   - Name spelling correct?: yes   - Date arrived in [place]: 09/11/14   - Country of origin: [place]   - Location of refugee camp (if applicable), how long there, and what caused patient to leave home country?: was in refugee camp is [place]. Was there a long time. Left because of the war.   - Primary language: kinyarwanda   -Requires intepreter (essentially speaks no [place])   - Education: Highest level of education: no schooling   - Prior work: Psychologist, sport and exercise   - Games developer name and number: [id number]   - Tobacco/alcohol/drug use: no   - Marriage Status: widowed   - Sexual activity: no   - Were you beaten or tortured in your country or refugee camp? no   - if yes: Are you having bad dreams about your experience?    Do you feel "jumpy" or "nervous?"    Do you feel that the experience is happening again?    Are you "super alert" or watchful?       PHYSICAL EXAM  Vitals:   03/31/16 1540  BP: (!) 146/82  Pulse: 85  Weight: 183 lb (83 kg)   Body mass  index is 31.41 kg/m.  Generalized: Well developed, in no acute distress   Neurological examination  Mentation: Alert. Patient does have difficulty following commands however not sure if this is due to a language barrier. Cranial nerve II-XII: Pupils were equal round reactive to light. Extraocular movements were full, visual field were full on confrontational test.  Uvula tongue midline. Head turning and shoulder shrug  were normal and symmetric. Motor: The motor testing reveals 5 over 5 strength of all 4 extremities. Good symmetric motor tone is noted throughout.  Sensory: Sensory testing is intact to soft touch on all 4 extremities. No evidence of extinction is noted.  Coordination: Cerebellar testing reveals good finger-nose-finger and heel-to-shin bilaterally.  Gait and station: Gait is normal.  Reflexes: Deep tendon reflexes are symmetric and normal bilaterally.   DIAGNOSTIC DATA (LABS, IMAGING, TESTING) - I reviewed patient records, labs, notes, testing and imaging myself where available.  Lab Results  Component Value Date   WBC 7.3 12/14/2015   HGB 13.8 12/14/2015   HCT 41.9 12/14/2015   MCV 91.9 12/14/2015  PLT 183 12/14/2015      Component Value Date/Time   NA 139 12/15/2015 0607   K 3.8 12/21/2015 1611   CL 107 12/15/2015 0607   CO2 23 12/15/2015 0607   GLUCOSE 91 12/15/2015 0607   BUN <5 (L) 12/15/2015 0607   CREATININE 0.58 12/15/2015 0607   CREATININE 0.54 11/21/2014 1009   CALCIUM 8.5 (L) 12/15/2015 0607   PROT 8.0 12/15/2015 0607   ALBUMIN 3.1 (L) 12/15/2015 0607   AST 50 (H) 12/15/2015 0607   ALT 33 (H) 03/01/2016 1154   ALKPHOS 89 12/15/2015 0607   BILITOT 1.1 12/15/2015 0607   GFRNONAA >60 12/15/2015 0607   GFRAA >60 12/15/2015 0607   Lab Results  Component Value Date   CHOL 131 12/13/2015   HDL 40 (L) 12/13/2015   LDLCALC 78 12/13/2015   TRIG 64 12/13/2015   CHOLHDL 3.3 12/13/2015   Lab Results  Component Value Date   HGBA1C 5.4 12/13/2015    Lab Results  Component Value Date   VITAMINB12 307 12/14/2015   Lab Results  Component Value Date   TSH 0.140 (L) 12/13/2015      ASSESSMENT AND PLAN 60 y.o. year old female  has a past medical history of Headache and Hypertension. here with:  1. Intractable headache  The patient continues to have headaches. According to our documentation the patient is taking Topamax 50 mg twice a day. I will increase this to 75 mg twice a day. If this is not beneficial the patient will let us know. She will follow-up in 3 months with Dr. Brett Fairy  The patient's daughter came in approximately 1 hour after her appointment time.- The nurse answered most of her questions. She reviewed the plan of care with the daughter. The daughter was also given the carotid Doppler results which were normal.   Ward Givens, MSN, NP-C 03/31/2016, 4:45 PM [facility] [address] [place], [place] [address] [phone]

## 2016-04-04 NOTE — Progress Notes (Signed)
I agree with the assessment and plan as directed by NP .The patient is known to me .   Mahlon Gabrielle, MD  

## 2016-04-08 ENCOUNTER — Ambulatory Visit (HOSPITAL_COMMUNITY)
Admission: RE | Admit: 2016-04-08 | Discharge: 2016-04-08 | Disposition: A | Discharge status: Home / Self Care | Payer: Medicaid Other | Source: Ambulatory Visit | Attending: Family Medicine | Admitting: Family Medicine

## 2016-04-08 DIAGNOSIS — M549 Dorsalgia, unspecified: Secondary | ICD-10-CM | POA: Diagnosis not present

## 2016-04-08 DIAGNOSIS — G8929 Other chronic pain: Secondary | ICD-10-CM | POA: Insufficient documentation

## 2016-04-08 DIAGNOSIS — M5137 Other intervertebral disc degeneration, lumbosacral region: Secondary | ICD-10-CM | POA: Insufficient documentation

## 2016-04-12 ENCOUNTER — Ambulatory Visit: Payer: Medicaid Other | Admitting: Family Medicine

## 2016-04-13 ENCOUNTER — Telehealth: Payer: Self-pay | Admitting: *Deleted

## 2016-04-13 NOTE — Telephone Encounter (Signed)
I spoke to daughter, Joseline.  I relayed that the increase of topamax requires PA for the new strength.  Pt has finished what she has per daughter.  I told her that it is a medication that she needs to stay on for her headaches.  There is some issues going on with her medicaid.  They have an appt this week. I told her that I tried to call [employer] and they referred me to have pt or family call about this needed change on her eligibility. (on her card).  She is to call or have SS call us after they are seen.

## 2016-05-12 ENCOUNTER — Telehealth: Payer: Self-pay | Admitting: *Deleted

## 2016-05-12 NOTE — Telephone Encounter (Signed)
Will complete when I'm back next week on Monday.

## 2016-05-12 NOTE — Telephone Encounter (Signed)
Prior Authorization received from [employer] for [employer]. Formulary and PA form placed in provider box for completion. Derl Barrow, RN

## 2016-05-20 NOTE — Telephone Encounter (Signed)
Received another PA request for Prevpac from [place]. Please advise.  Derl Barrow, RN

## 2016-05-23 MED ORDER — BIS SUBCIT-METRONID-TETRACYC 140-125-125 MG PO CAPS: ORAL_CAPSULE | ORAL | 0 refills | Status: DC

## 2016-05-23 NOTE — Telephone Encounter (Signed)
I will send in Pylera, which is the preferred medicine instead.

## 2016-05-31 ENCOUNTER — Ambulatory Visit (INDEPENDENT_AMBULATORY_CARE_PROVIDER_SITE_OTHER): Payer: Medicaid Other | Admitting: Family Medicine

## 2016-05-31 ENCOUNTER — Encounter: Payer: Self-pay | Admitting: Family Medicine

## 2016-05-31 DIAGNOSIS — K5900 Constipation, unspecified: Secondary | ICD-10-CM

## 2016-05-31 DIAGNOSIS — G8929 Other chronic pain: Secondary | ICD-10-CM | POA: Diagnosis not present

## 2016-05-31 DIAGNOSIS — G43011 Migraine without aura, intractable, with status migrainosus: Secondary | ICD-10-CM | POA: Diagnosis not present

## 2016-05-31 DIAGNOSIS — R1013 Epigastric pain: Secondary | ICD-10-CM | POA: Diagnosis not present

## 2016-05-31 DIAGNOSIS — R030 Elevated blood-pressure reading, without diagnosis of hypertension: Secondary | ICD-10-CM | POA: Diagnosis not present

## 2016-05-31 DIAGNOSIS — M545 Low back pain: Secondary | ICD-10-CM | POA: Diagnosis not present

## 2016-05-31 MED ORDER — TOPIRAMATE 50 MG PO TABS: 75.0000 mg | ORAL_TABLET | Freq: Two times a day (BID) | ORAL | 3 refills | Status: DC

## 2016-05-31 MED ORDER — TRAMADOL HCL 50 MG PO TABS: 50.0000 mg | ORAL_TABLET | Freq: Three times a day (TID) | ORAL | 1 refills | Status: DC | PRN

## 2016-05-31 MED ORDER — HYDROCHLOROTHIAZIDE 25 MG PO TABS: 25.0000 mg | ORAL_TABLET | Freq: Every day | ORAL | 3 refills | Status: DC

## 2016-05-31 MED ORDER — BIS SUBCIT-METRONID-TETRACYC 140-125-125 MG PO CAPS: ORAL_CAPSULE | ORAL | 0 refills | Status: DC

## 2016-05-31 NOTE — Assessment & Plan Note (Signed)
Treating with Pylera for H. pylori infection. I interpreted this prescription for her and gave to her daughter.

## 2016-05-31 NOTE — Patient Instructions (Signed)
For your constipation, we'll need to try several ways to treat this:  1)  First, take a stool softener twice daily for the next week.  After that, just take it once daily.  It is called Docusate/colace. 2) Take prune juice once in the AM and once in the PM for the next several days.  Stop taking it if you have diarrhea.  You can increase this to 3 times a day to help you go as well.  Take the Tramadol for back pain if she needs it.  Increase the Topamax to 1 1/2 pills twice a day.  Take the hydrochlorothiazide as a blood pressure medicine once in the morning.  Take the Pylera which is 3 pills in the morning and 3 pills in the evening. This will help her stomach.   Come back and see me in about one month.

## 2016-05-31 NOTE — Progress Notes (Signed)
Subjective:    Stephanie Swanson is a 61 y.o. female who presents to [facility] today for headaches and abdominal pain:  1.  Chronic migraines: She has been on Topamax for some time. She has recently increased in November to 75 mg twice a day at Last neurologist ointment-however she never started taking this increased dose. Still with fairly frequent migraines. These are sometimes relieved with tramadol which she takes for her back pain. No falls. No vision changes.  2.  Elevated BP:  Persistently elevated in the past. Several systolics of greater than 150. She has never been on any blood pressure medicine.  Not checking it regularly.  No HA, CP, dizziness, shortness of breath, palpitations, or LE swelling.   BP Readings from Last 3 Encounters:  05/31/16 (!) 142/80  03/31/16 (!) 146/82  03/22/16 (!) 169/76   3.  Constipation: No issue for the patient. Has bowel movement every 2-4 days. The do not know anything to take over-the-counter for relief. She is passing gas well. This is somewhat affected her appetite. They've tried increasing her water intake and vegetables.   4. H. pylori follow-up: She never picked up the Pylera. Daughter is present with her today and they're asking for this prescription. It sounds like her Medicaid ran out at one point and they were not able to pick up the medicine. Still with fairly frequent epigastric abdominal pain.  Back pain is better: No bladder/bowel incontinence. Tramadol is really helping. Daughter and patient both state she is ambulating much better now. No radiation of bilateral lower extremities.  ROS as above per HPI.    The following portions of the patient's history were reviewed and updated as appropriate: allergies, current medications, past medical history, family and social history, and problem list. Patient is a nonsmoker.    PMH reviewed.  Past Medical History:  Diagnosis Date  . Headache   . Hypertension    No past surgical history on  file.  Medications reviewed. Current Outpatient Prescriptions  Medication Sig Dispense Refill  . amoxicillin-clarithromycin-lansoprazole (PREVPAC) combo pack Take by mouth 2 (two) times daily. Follow package directions. 1 kit 0  . bismuth-metronidazole-tetracycline (PYLERA) 140-125-125 MG capsule Take 3 capsules by mouth 4 (four) times daily -  before meals and at bedtime. 84 capsule 0  . Glecaprevir-Pibrentasvir (MAVYRET) 100-40 MG TABS Take 3 tablets by mouth daily. 90 tablet 2  . ibuprofen (ADVIL,MOTRIN) 600 MG tablet Take 1 tablet (600 mg total) by mouth every 8 (eight) hours as needed. 30 tablet 0  . topiramate (TOPAMAX) 50 MG tablet Take 1.5 tablets (75 mg total) by mouth 2 (two) times daily. 90 tablet 3  . traMADol (ULTRAM) 50 MG tablet Take 1 tablet (50 mg total) by mouth every 8 (eight) hours as needed. 30 tablet 0   No current facility-administered medications for this visit.      Objective:   Physical Exam BP (!) 142/80   Pulse 91   Temp 98 F (36.7 C) (Oral)   Ht _0  (1.626 m)   Wt 178 lb 3.2 oz (80.8 kg)   SpO2 97%   BMI 30.59 kg/m  Gen:  Alert, cooperative patient who appears stated age in no acute distress.  Vital signs reviewed. HEENT: EOMI,  MMM.  PERRL.   Cardiac:  Regular rate and rhythm without murmur auscultated.  Good S1/S2. Pulm:  Clear to auscultation bilaterally with good air movement.  No wheezes or rales noted.   Abd:  Soft/nondistended/Tender only to deep palpation  in the epigastrium. Good bowel sounds. Abdomen is otherwise benign. Exts: Non edematous BL  LE, warm and well perfused. Back:  Nontender on my exam today.     No results found for this or any previous visit (from the past 72 hour(s)).

## 2016-05-31 NOTE — Assessment & Plan Note (Signed)
Starting hydrochlorothiazide today. Treatment of this and improvement of blood pressure may also help her headaches.

## 2016-05-31 NOTE — Assessment & Plan Note (Signed)
Better with tramadol. I provided her a refill of this today. No red flags.

## 2016-05-31 NOTE — Assessment & Plan Note (Signed)
New problem for the patient. See instructions for full details. We're doing Colace and prune juice.  Will discuss at FU in 1 month.

## 2016-05-31 NOTE — Assessment & Plan Note (Signed)
Patient did not actually take increased dose of Topamax. I have printed her a prescription for one half pills. And discussed with her daughter. Daughter and patient both expressed understanding. Headaches are gradually better but still present most days of the week.

## 2016-06-02 DIAGNOSIS — Z0271 Encounter for disability determination: Secondary | ICD-10-CM

## 2016-06-06 ENCOUNTER — Ambulatory Visit: Payer: Medicaid Other | Admitting: Internal Medicine

## 2016-06-07 ENCOUNTER — Encounter: Payer: Self-pay | Admitting: Internal Medicine

## 2016-06-07 ENCOUNTER — Other Ambulatory Visit: Payer: Self-pay | Admitting: Internal Medicine

## 2016-06-07 ENCOUNTER — Ambulatory Visit (INDEPENDENT_AMBULATORY_CARE_PROVIDER_SITE_OTHER): Payer: Medicaid Other | Admitting: Internal Medicine

## 2016-06-07 VITALS — BP 156/84 | HR 82 | Temp 97.7°F | Wt 178.0 lb

## 2016-06-07 DIAGNOSIS — K7460 Unspecified cirrhosis of liver: Secondary | ICD-10-CM | POA: Diagnosis not present

## 2016-06-07 DIAGNOSIS — B182 Chronic viral hepatitis C: Secondary | ICD-10-CM | POA: Diagnosis present

## 2016-06-07 MED ORDER — GLECAPREVIR-PIBRENTASVIR 100-40 MG PO TABS: 3.0000 | ORAL_TABLET | Freq: Every day | ORAL | 2 refills | Status: DC

## 2016-06-07 NOTE — Assessment & Plan Note (Signed)
Will send in [place] for 12 weeks with concren for cirrhosis on ultrasound.  Will inform her by phone when it is approved.

## 2016-06-07 NOTE — Assessment & Plan Note (Signed)
Possible cirrhosis.  I will check fibrosure and if equivocal, will check elastography.

## 2016-06-07 NOTE — Addendum Note (Signed)
Addended by: Dolan Amen D on: 06/07/2016 02:43 PM   Modules accepted: Orders

## 2016-06-07 NOTE — Progress Notes (Signed)
   Subjective:    Patient ID: Stephanie Swanson, female    DOB: 1955/06/12, 61 y.o.   MRN: [id number]  HPI Here for follow up of HCV.  I saw her as a new patient in October and we started the process but her medicaid was family planning only.  We sent her a letter to make her aware of that and now she is approved for regular medicaid, according to the daughter with her.  We did do a test prescription and was approved.  She has genotype 4, never treated.  Ultrasound with concern for cirrhosis but not definitive.  Normal platelets and albumin decreased at 3.1.  Interview through translator on phone.     Review of Systems  Constitutional: Negative for fatigue.  Skin: Negative for rash.  Neurological: Negative for dizziness and light-headedness.       Objective:   Physical Exam  Constitutional: She appears well-developed and well-nourished. No distress.  Eyes: No scleral icterus.  Cardiovascular: Normal rate, regular rhythm and normal heart sounds.   Skin: No rash noted.          Assessment & Plan:

## 2016-06-11 LAB — LIVER FIBROSIS, FIBROTEST-ACTITEST
ALPHA-2-MACROGLOBULIN: 380 mg/dL — AB (ref 106–279)
ALT: 31 U/L — ABNORMAL HIGH (ref 6–29)
APOLIPOPROTEIN A1: 124 mg/dL (ref 101–198)
Bilirubin: 0.6 mg/dL (ref 0.2–1.2)
FIBROSIS SCORE: 0.81
GGT: 116 U/L — ABNORMAL HIGH (ref 3–65)
Haptoglobin: 58 mg/dL (ref 43–212)
Necroinflammat ACT Score: 0.28
REFERENCE ID: 1811043

## 2016-06-17 LAB — HCV RNA, QN PCR RFLX GENO, LIPA
HCV RNA, PCR, QN: 5.93 {Log_IU}/mL — AB
HCV RNA, PCR, QN: 859000 [IU]/mL — AB

## 2016-06-17 LAB — HEPATITIS C GENOTYPE

## 2016-06-20 ENCOUNTER — Ambulatory Visit: Payer: Medicaid Other | Admitting: Neurology

## 2016-06-30 MED FILL — MAVYRET 100-40 MG TABS: 100-40 | 28 days supply | Qty: 84 | Fill #0

## 2016-07-06 ENCOUNTER — Encounter: Payer: Self-pay | Admitting: Pharmacy Technician

## 2016-07-28 MED FILL — MAVYRET 100-40 MG TABS: 100-40 | 28 days supply | Qty: 84 | Fill #1

## 2016-08-03 ENCOUNTER — Ambulatory Visit: Payer: Medicaid Other

## 2016-08-05 ENCOUNTER — Ambulatory Visit (INDEPENDENT_AMBULATORY_CARE_PROVIDER_SITE_OTHER): Payer: Medicaid Other | Admitting: Pharmacist

## 2016-08-05 DIAGNOSIS — B182 Chronic viral hepatitis C: Secondary | ICD-10-CM

## 2016-08-05 NOTE — Progress Notes (Signed)
HPI: Stephanie Swanson is a 61 y.o. female who presents to the [facility] for follow-up of her Hep C infection.  She has genotype 4, F4, and started Mavyret on 07/03/16.   Lab Results  Component Value Date   HCVGENOTYPE 4ACD 06/07/2016   HEPCGENOTYPE 4 12/14/2015    Allergies: No Known Allergies  Past Medical History: Past Medical History:  Diagnosis Date  . Headache   . Hypertension     Social History: Social History   Social History  . Marital status: Single    Spouse name: N/A  . Number of children: 4  . Years of education: 0   Social History Main Topics  . Smoking status: Never Smoker  . Smokeless tobacco: Never Used  . Alcohol use No  . Drug use: No  . Sexual activity: Not on file   Other Topics Concern  . Not on file   Social History Narrative   Immigrant Social History:   - Name spelling correct?: yes   - Date arrived in [place]: 09/11/14   - Country of origin: [place]   - Location of refugee camp (if applicable), how long there, and what caused patient to leave home country?: was in refugee camp is [place]. Was there a long time. Left because of the war.   - Primary language: kinyarwanda   -Requires intepreter (essentially speaks no [place])   - Education: Highest level of education: no schooling   - Prior work: Psychologist, sport and exercise   - Games developer name and number: [id number]   - Tobacco/alcohol/drug use: no   - Marriage Status: widowed   - Sexual activity: no   - Were you beaten or tortured in your country or refugee camp? no   - if yes: Are you having bad dreams about your experience?    Do you feel "jumpy" or "nervous?"    Do you feel that the experience is happening again?    Are you "super alert" or watchful?     Labs: Hep B S Ab (no units)  Date Value  03/01/2016 NEG   Hepatitis B Surface Ag (no units)  Date Value  03/01/2016 NEGATIVE   HCV Ab  (s/co ratio)  Date Value  12/13/2015 >11.0 (H)    Lab Results  Component Value Date   HCVGENOTYPE 4ACD 06/07/2016   HEPCGENOTYPE 4 12/14/2015    No flowsheet data found.  AST (U/L)  Date Value  12/15/2015 50 (H)  12/14/2015 48 (H)  12/12/2015 52 (H)   ALT (U/L)  Date Value  06/07/2016 31 (H)  03/01/2016 33 (H)  12/15/2015 38  12/14/2015 34  12/12/2015 37   INR (no units)  Date Value  12/12/2015 1.18    CrCl: CrCl cannot be calculated (Patient's most recent lab result is older than the maximum 21 days allowed.).  Fibrosis Score: F4 as assessed by fibrosure   Child-Pugh Score: A  Previous Treatment Regimen: None  Assessment: Stephanie Swanson is here today with her daughter and an interpreter for follow-up of her Hep C. She started 12 weeks of Mavyret over a month ago.  [facility] is mailing it to her.  She tells me she has not missed any doses and takes it at 2pm every day with food, all three tablets together. She states she has headaches at baseline, and they seem a little worse since starting [place]. She is seeing a neurologist for them next week.  Her daughter is bringing her to her appointments and can only come Monday or Friday  mornings.  I tried to make an EOT visit with Dr. Linus Salmons, since she is F4 but he had nothing available until the fall.  She will follow up at EOT with pharmacy instead.  She will get a Hep C viral load today for Medicaid extension and to see an early VL on treatment.    Plans: - Mavyret x 12 weeks - Hep C VL today - F/u with pharmacy EOT on 6/4 at 10:30am  Cassie L. Kuppelweiser, PharmD, [facility] 08/05/2016, 10:28 AM

## 2016-08-08 ENCOUNTER — Ambulatory Visit (INDEPENDENT_AMBULATORY_CARE_PROVIDER_SITE_OTHER): Payer: Medicaid Other | Admitting: Neurology

## 2016-08-08 ENCOUNTER — Encounter: Payer: Self-pay | Admitting: Neurology

## 2016-08-08 VITALS — BP 132/78 | HR 80 | Resp 14 | Ht 64.0 in | Wt 177.0 lb

## 2016-08-08 DIAGNOSIS — G44221 Chronic tension-type headache, intractable: Secondary | ICD-10-CM

## 2016-08-08 LAB — HEPATITIS C RNA QUANTITATIVE
HCV QUANT: NOT DETECTED [IU]/mL
HCV Quantitative Log: <1.18 Log IU/mL

## 2016-08-08 MED ORDER — TOPIRAMATE 50 MG PO TABS: 50.0000 mg | ORAL_TABLET | Freq: Two times a day (BID) | ORAL | 0 refills | Status: DC

## 2016-08-08 MED ORDER — TOPIRAMATE 50 MG PO TABS: 75.0000 mg | ORAL_TABLET | Freq: Two times a day (BID) | ORAL | 3 refills | Status: DC

## 2016-08-08 NOTE — Patient Instructions (Signed)
Topiramate tablets What is this medicine? TOPIRAMATE (toe PYRE a mate) is used to treat seizures in adults or children with epilepsy. It is also used for the prevention of migraine headaches. This medicine may be used for other purposes; ask your health care provider or pharmacist if you have questions. COMMON BRAND NAME(S): Topamax, Topiragen What should I tell my health care provider before I take this medicine? They need to know if you have any of these conditions: -bleeding disorders -cirrhosis of the liver or liver disease -diarrhea -glaucoma -kidney stones or kidney disease -low blood counts, like low white cell, platelet, or red cell counts -lung disease like asthma, obstructive pulmonary disease, emphysema -metabolic acidosis -on a ketogenic diet -schedule for surgery or a procedure -suicidal thoughts, plans, or attempt; a previous suicide attempt by you or a family member -an unusual or allergic reaction to topiramate, other medicines, foods, dyes, or preservatives -pregnant or trying to get pregnant -breast-feeding How should I use this medicine? Take this medicine by mouth with a glass of water. Follow the directions on the prescription label. Do not crush or chew. You may take this medicine with meals. Take your medicine at regular intervals. Do not take it more often than directed. Talk to your pediatrician regarding the use of this medicine in children. Special care may be needed. While this drug may be prescribed for children as young as 61 years of age for selected conditions, precautions do apply. Overdosage: If you think you have taken too much of this medicine contact a poison control center or emergency room at once. NOTE: This medicine is only for you. Do not share this medicine with others. What if I miss a dose? If you miss a dose, take it as soon as you can. If your next dose is to be taken in less than 6 hours, then do not take the missed dose. Take the next dose at  your regular time. Do not take double or extra doses. What may interact with this medicine? Do not take this medicine with any of the following medications: -probenecid This medicine may also interact with the following medications: -acetazolamide -alcohol -amitriptyline -aspirin and aspirin-like medicines -birth control pills -certain medicines for depression -certain medicines for seizures -certain medicines that treat or prevent blood clots like warfarin, enoxaparin, dalteparin, apixaban, dabigatran, and rivaroxaban -digoxin -hydrochlorothiazide -lithium -medicines for pain, sleep, or muscle relaxation -metformin -methazolamide -NSAIDS, medicines for pain and inflammation, like ibuprofen or naproxen -pioglitazone -risperidone This list may not describe all possible interactions. Give your health care provider a list of all the medicines, herbs, non-prescription drugs, or dietary supplements you use. Also tell them if you smoke, drink alcohol, or use illegal drugs. Some items may interact with your medicine. What should I watch for while using this medicine? Visit your doctor or health care professional for regular checks on your progress. Do not stop taking this medicine suddenly. This increases the risk of seizures if you are using this medicine to control epilepsy. Wear a medical identification bracelet or chain to say you have epilepsy or seizures, and carry a card that lists all your medicines. This medicine can decrease sweating and increase your body temperature. Watch for signs of deceased sweating or fever, especially in 61 years. Avoid extreme heat, hot baths, and saunas. Be careful about exercising, especially in hot weather. Contact your health care provider right away if you notice a fever or decrease in sweating. You should drink plenty of fluids while taking this medicine.  If you have had kidney stones in the past, this will help to reduce your chances of forming kidney  stones. If you have stomach pain, with nausea or vomiting and yellowing of your eyes or skin, call your doctor immediately. You may get drowsy, dizzy, or have blurred vision. Do not drive, use machinery, or do anything that needs mental alertness until you know how this medicine affects you. To reduce dizziness, do not sit or stand up quickly, especially if you are an older patient. Alcohol can increase drowsiness and dizziness. Avoid alcoholic drinks. If you notice blurred vision, eye pain, or other eye problems, seek medical attention at once for an eye exam. The use of this medicine may increase the chance of suicidal thoughts or actions. Pay special attention to how you are responding while on this medicine. Any worsening of mood, or thoughts of suicide or dying should be reported to your health care professional right away. This medicine may increase the chance of developing metabolic acidosis. If left untreated, this can cause kidney stones, bone disease, or slowed growth in children. Symptoms include breathing fast, fatigue, loss of appetite, irregular heartbeat, or loss of consciousness. Call your doctor immediately if you experience any of these side effects. Also, tell your doctor about any surgery you plan on having while taking this medicine since this may increase your risk for metabolic acidosis. Birth control pills may not work properly while you are taking this medicine. Talk to your doctor about using an extra method of birth control. Women who become pregnant while using this medicine may enroll in the [place] American Antiepileptic Drug Pregnancy Registry by calling 1-888-233-2334. This registry collects information about the safety of antiepileptic drug use during pregnancy. What side effects may I notice from receiving this medicine? Side effects that you should report to your doctor or health care professional as soon as possible: -allergic reactions like skin rash, itching or hives,  swelling of the face, lips, or tongue -decreased sweating and/or rise in body temperature -depression -difficulty breathing, fast or irregular breathing patterns -difficulty speaking -difficulty walking or controlling muscle movements -hearing impairment -redness, blistering, peeling or loosening of the skin, including inside the mouth -tingling, pain or numbness in the hands or feet -unusual bleeding or bruising -unusually weak or tired -worsening of mood, thoughts or actions of suicide or dying Side effects that usually do not require medical attention (report to your doctor or health care professional if they continue or are bothersome): -altered taste -back pain, joint or muscle aches and pains -diarrhea, or constipation -headache -loss of appetite -nausea -stomach upset, indigestion -tremors This list may not describe all possible side effects. Call your doctor for medical advice about side effects. You may report side effects to FDA at 1-800-FDA-1088. Where should I keep my medicine? Keep out of the reach of children. Store at room temperature between 15 and 30 degrees C (59 and 86 degrees F) in a tightly closed container. Protect from moisture. Throw away any unused medicine after the expiration date. NOTE: This sheet is a summary. It may not cover all possible information. If you have questions about this medicine, talk to your doctor, pharmacist, or health care provider.  2018 Elsevier/Gold Standard (2013-04-22 23:17:57)  

## 2016-08-08 NOTE — Progress Notes (Signed)
PATIENT: Stephanie Swanson DOB: [birth year]  REASON FOR VISIT: follow up- headaches HISTORY FROM: patient  HISTORY OF PRESENT ILLNESS: Ms. Stephanie Swanson. Is seen here today for a revisit. She was recently diagnosed with hepatitis C and started on an oral antiviral medication. She was told that she cannot take other medications with it without a doctor's approval. I explained that topiramate is not metabolized through the liver and should not interfere with his medication, and the patient has now been off for over a month. I would like her to resume topiramate at 50 mg tablets and he may have to start this 50 mg twice a day and in a couple of weeks increase again to 75 mg twice a day. She has had more headaches after TPM was discontinued.   MM: Ms. Stephanie Swanson is a 61 year old female with a history of intractable headaches. She returns today for follow-up. She is here today with an interpreter. However her daughter has not arrived for the visit yet. The patient is not aware of any medication that she is taking. She states that she continues to have headaches. The headaches typically occur on the top of the head extending down to the temples and across the maxillary sinuses. The patient denies having any symptoms related to her sinuses. She's never seen a ENT. She states that her dizziness did get slightly better with Topamax. She does have some light and noise sensitivity with her headaches. States that she has a headache daily. She had carotid Dopplers which were normal. She returns today for an evaluation.  HISTORY 01/27/16: Stephanie Swanson is a 89 y.o.  [place] female is seen here as a referral from Dr. Erlinda Hong, [facility] hospitalist for the [facility] system. A translator and her daughter are present.  The patient presented to the emergency room in a state of decreased alertness, admitted on 12/12/2015 due to responsiveness only to pain, she was brought in by ambulance after her  family had found her on the couch not responding to verbal stimuli or touching. After she presented to the emergency room she appeared to improve continuously and responded to verbal commands. Her family members translated in the emergency room and she was able to move all 4 extremities. She was worked up for possible stroke but her MRI was negative and his stroke workup came back negative. She reported that she had a severe headache. She is not using any herbal medicines, illicit drugs she does not drink alcohol, she has no history of a seizure disorder, she was tested for ammonia levels, and these were mildly elevated, vitamin B12 level was within normal limits, thiamine was normal, thyroid-stimulating hormone was normal, an HIV test was negative, RPR was negative, and sedimentation rate was within normal limits. She had a urinary tract infection with colonies of Streptococcus viridans. He was given amoxicillin for 5 days. On August 15 she was discharged after she passed a swallow test EEG was normal, neurology consult stated that outpatient follow-up with Dr. Mingo Amber, primary care physician, is recommended. There also recommended that she start low dose amitriptyline at night for migraine treatment and prophylaxis. She was recommended to see PCP within a week, but hasn't. She was told to see infectious disease,  had blood drawn..  Her daughter reports that her mother was again unsteady on Sunday and fell after the church service.She is having dizziness. She described a spinning sensation, severe vertigo.  She recalls having a similar sensation before developing severe headaches and  then amnesia on the day that she presented to the emergency department.  REVIEW OF SYSTEMS: Out of a complete 14 system review of symptoms, the patient complains only of the following symptoms, and all other reviewed systems are negative.  No more spells, no confusion , but fatigue and aches and pain, and headaches.      ALLERGIES: No Known Allergies   EXAM: MRI HEAD WITHOUT CONTRAST  TECHNIQUE: Multiplanar, multiecho pulse sequences of the brain and surrounding structures were obtained without intravenous contrast.  COMPARISON:  Prior CT from 12/12/2015.  FINDINGS: Age appropriate cerebral atrophy present. No significant cerebral white matter disease.  No abnormal foci of restricted diffusion to suggest acute or subacute ischemia. Gray-white matter differentiation well maintained. Major intracranial vascular flow voids are preserved. No acute or chronic intracranial hemorrhage. No areas of chronic infarction.  No mass lesion, midline shift, or mass effect. No hydrocephalus. No extra-axial fluid collection. Major dural sinuses are grossly patent.  Craniocervical junction normal. Visualized upper cervical spine unremarkable.  Pituitary gland within normal limits. No acute abnormality about the globes and orbits.  Mild mucosal thickening within the ethmoidal air cells and maxillary sinuses. No mastoid effusion. Inner ear structures grossly normal.  Bone marrow signal intensity within normal limits. Hyperostosis frontalis interna noted. No scalp soft tissue abnormality.  IMPRESSION: Normal brain MRI.  No acute intracranial process identified.   Electronically Signed   By: Jeannine Boga M.D.   On: 12/13/2015 02:31  HOME MEDICATIONS: Outpatient Medications Prior to Visit  Medication Sig Dispense Refill  . Glecaprevir-Pibrentasvir (MAVYRET) 100-40 MG TABS Take 3 tablets by mouth daily. 90 tablet 2  . bismuth-metronidazole-tetracycline (PYLERA) 140-125-125 MG capsule Take 3 capsules by mouth 4 (four) times daily -  before meals and at bedtime. (Patient not taking: Reported on 08/08/2016) 84 capsule 0  . hydrochlorothiazide (HYDRODIURIL) 25 MG tablet Take 1 tablet (25 mg total) by mouth daily. (Patient not taking: Reported on 08/08/2016) 90 tablet 3  . ibuprofen  (ADVIL,MOTRIN) 600 MG tablet Take 1 tablet (600 mg total) by mouth every 8 (eight) hours as needed. (Patient not taking: Reported on 08/08/2016) 30 tablet 0  . topiramate (TOPAMAX) 50 MG tablet Take 1.5 tablets (75 mg total) by mouth 2 (two) times daily. (Patient not taking: Reported on 08/08/2016) 90 tablet 3  . traMADol (ULTRAM) 50 MG tablet Take 1 tablet (50 mg total) by mouth every 8 (eight) hours as needed. (Patient not taking: Reported on 08/08/2016) 30 tablet 1   No facility-administered medications prior to visit.     PAST MEDICAL HISTORY: Past Medical History:  Diagnosis Date  . Headache   . Hypertension     PAST SURGICAL HISTORY: No past surgical history on file.  FAMILY HISTORY: No family history on file.  SOCIAL HISTORY: Social History   Social History  . Marital status: Single    Spouse name: N/A  . Number of children: 4  . Years of education: 0   Occupational History  . Not on file.   Social History Main Topics  . Smoking status: Never Smoker  . Smokeless tobacco: Never Used  . Alcohol use No  . Drug use: No  . Sexual activity: Not on file   Other Topics Concern  . Not on file   Social History Narrative   Immigrant Social History:   - Name spelling correct?: yes   - Date arrived in [place]: 09/11/14   - Country of origin: [place]   - Location of refugee  camp (if applicable), how long there, and what caused patient to leave home country?: was in refugee camp is [place]. Was there a long time. Left because of the war.   - Primary language: kinyarwanda   -Requires intepreter (essentially speaks no [place])   - Education: Highest level of education: no schooling   - Prior work: Psychologist, sport and exercise   - Games developer name and number: [id number]   - Tobacco/alcohol/drug use: no   - Marriage Status: widowed   - Sexual activity: no   - Were you beaten or tortured in your country or refugee camp? no   - if yes: Are you having bad dreams about your experience?     Do you feel "jumpy" or "nervous?"    Do you feel that the experience is happening again?    Are you "super alert" or watchful?       PHYSICAL EXAM  Vitals:   08/08/16 1100  BP: 132/78  Pulse: 80  Resp: 14  Weight: 177 lb (80.3 kg)  Height: 5\' 4"  (1.626 m)   Body mass index is 30.38 kg/m.  Generalized: Well developed, in no acute distress   Neurological examination  Mentation: Alert. Patient does have difficulty following commands - needs translator intruction. however not sure if this is due to a language barrier. Cranial nerve:  Preserved taste and smell. Pupils were equal round reactive to light. Extraocular movements were full, visual field were full on confrontational test.  Uvula tongue midline. Head turning and shoulder shrug  were normal and symmetric. She grimases as if in pain, but her translator confirmed she is not in pain.  Motor:  Full strength with symmetric motor tone and absence of fasciculations is noted throughout.  intact to soft touch on all 4 extremities.  Normal  finger-nose-finger intact  bilaterally.  Gait and station: Gait is intact  Reflexes: Deep tendon reflexes are symmetric bilaterally.   DIAGNOSTIC DATA (LABS, IMAGING, TESTING) - I reviewed patient records, labs, notes, testing and imaging myself where available. Since the patient started on Heep C treatments she has had regular labs, the last on Friday . ASSESSMENT AND PLAN 61 y.o. year old female  has a past medical history of Headache and Hypertension. here with:  1. Intractable headache of unknown origin-  Improved under TPM.   The patient continues to have headaches. According to our documentation the patient is not longer taking Topamax , but had worsening headaches.  All medications were discontinued after the patient started on antiviral therapy for hepatitis C. I did not find any evidence of topiramate would  interfere with the medication and will restart her on topiramate at 50 mg twice a day. I will increase this to 75 mg twice a day. She will follow-up in 12 months with NP.   carotid Doppler results which were normal. MRI normal.   Larey Seat, MD  08/08/2016, 11:06 AM [facility] [address] [place], [place] [address] [phone]

## 2016-08-25 MED FILL — MAVYRET 100-40 MG TABS: 100-40 | 28 days supply | Qty: 84 | Fill #2

## 2016-10-03 ENCOUNTER — Other Ambulatory Visit: Payer: Self-pay | Admitting: Internal Medicine

## 2016-10-03 ENCOUNTER — Ambulatory Visit: Payer: Medicaid Other

## 2016-10-03 DIAGNOSIS — B182 Chronic viral hepatitis C: Secondary | ICD-10-CM

## 2016-10-06 ENCOUNTER — Telehealth: Payer: Self-pay | Admitting: *Deleted

## 2016-10-06 NOTE — Telephone Encounter (Signed)
No prior authorization required for CPT 76700 (elastography) per Colletta Maryland L at [facility]. RN asked for reference number, Colletta Maryland stated it would be patient's ID followed by date, her name and time. [id number] Patient scheduled for elastography at [facility] 6/20 8am (arrive 7:45) with nothing to eat or drink for 8 hours. RN attempted to call patient's via [employer], no Optometrist available for [employer].  RN left message for daughter Joseline with appointment information - per demographic information, she speaks [place] and is patient's home number. Landis Gandy, RN

## 2016-10-07 ENCOUNTER — Ambulatory Visit (INDEPENDENT_AMBULATORY_CARE_PROVIDER_SITE_OTHER): Payer: Medicaid Other | Admitting: Pharmacist Clinician (PhC)/ Clinical Pharmacy Specialist

## 2016-10-07 DIAGNOSIS — B182 Chronic viral hepatitis C: Secondary | ICD-10-CM | POA: Diagnosis present

## 2016-10-07 NOTE — Telephone Encounter (Signed)
You may know she needs [facility] Assistance first. She saw Minh this am.  thanks

## 2016-10-07 NOTE — Patient Instructions (Signed)
Come back lab in September then see Dr. Linus Salmons a week

## 2016-10-07 NOTE — Progress Notes (Signed)
HPI: Stephanie Swanson is a 61 y.o. female who is here for her EOT treatment for hep C.   Lab Results  Component Value Date   HCVGENOTYPE 4ACD 06/07/2016   HEPCGENOTYPE 4 12/14/2015    Allergies: No Known Allergies  Vitals:    Past Medical History: Past Medical History:  Diagnosis Date  . Headache   . Hypertension     Social History: Social History   Social History  . Marital status: Single    Spouse name: N/A  . Number of children: 4  . Years of education: 0   Social History Main Topics  . Smoking status: Never Smoker  . Smokeless tobacco: Never Used  . Alcohol use No  . Drug use: No  . Sexual activity: Not on file   Other Topics Concern  . Not on file   Social History Narrative   Immigrant Social History:   - Name spelling correct?: yes   - Date arrived in [place]: 09/11/14   - Country of origin: [place]   - Location of refugee camp (if applicable), how long there, and what caused patient to leave home country?: was in refugee camp is [place]. Was there a long time. Left because of the war.   - Primary language: kinyarwanda   -Requires intepreter (essentially speaks no [place])   - Education: Highest level of education: no schooling   - Prior work: Psychologist, sport and exercise   - Games developer name and number: [id number]   - Tobacco/alcohol/drug use: no   - Marriage Status: widowed   - Sexual activity: no   - Were you beaten or tortured in your country or refugee camp? no   - if yes: Are you having bad dreams about your experience?    Do you feel "jumpy" or "nervous?"    Do you feel that the experience is happening again?    Are you "super alert" or watchful?     Labs: Hep B S Ab (no units)  Date Value  03/01/2016 NEG   Hepatitis B Surface Ag (no units)  Date Value  03/01/2016 NEGATIVE   HCV Ab (s/co ratio)  Date Value  12/13/2015 >11.0 (H)    Lab Results  Component  Value Date   HCVGENOTYPE 4ACD 06/07/2016   HEPCGENOTYPE 4 12/14/2015    Hepatitis C RNA quantitative Latest Ref Rng & Units 08/05/2016  HCV Quantitative NOT DETECTED IU/mL <15 NOT DETECTED  HCV Quantitative Log NOT DETECTED Log IU/mL <1.18 NOT DETECTED    AST (U/L)  Date Value  12/15/2015 50 (H)  12/14/2015 48 (H)  12/12/2015 52 (H)   ALT (U/L)  Date Value  06/07/2016 31 (H)  03/01/2016 33 (H)  12/15/2015 38  12/14/2015 34  12/12/2015 37   INR (no units)  Date Value  12/12/2015 1.18    CrCl: CrCl cannot be calculated (Patient's most recent lab result is older than the maximum 21 days allowed.).  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 She completed a full 12 wks of therapy. We were planning to do a EOT VL today, however, her medicaid has been converted to family planning only. Therefore, it will not cover labs or anything else. After discussing with Dr. Linus Salmons, we are going to hold until the [facility] labs. Explained to the daughter on why we are doing this. Gave the [employer] and [facility] patient assistance forms  so we can proceed with [place] and labs. She has an appt for an [place] on 6/20. This will be a significant cost if she doesn't have the assistance approved. Her daughter will call radiology to delay it until the assistance is approved. She is going to fill out both forms today and send it back in. Appts for SVR12 and cure visit have been made.   Recommendations:  SVR12 in 3 mo Cure visit with Dr. Linus Salmons in 3 mo   [place], [place].D., BCPS, AAHIVP Clinical Infectious [place] for Infectious Disease 10/07/2016, 10:36 AM

## 2016-10-10 NOTE — Telephone Encounter (Signed)
Stephanie Swanson, is patient aware that she needs [facility] Financial Assistance? [name]

## 2016-10-10 NOTE — Telephone Encounter (Signed)
She is aware. Her daughter was going to take care of the application.

## 2016-10-19 ENCOUNTER — Ambulatory Visit (HOSPITAL_COMMUNITY): Payer: Medicaid Other

## 2016-10-25 ENCOUNTER — Other Ambulatory Visit: Payer: Self-pay

## 2016-10-25 DIAGNOSIS — K7400 Hepatic fibrosis, unspecified: Secondary | ICD-10-CM

## 2016-10-25 DIAGNOSIS — B182 Chronic viral hepatitis C: Secondary | ICD-10-CM

## 2016-10-27 ENCOUNTER — Telehealth: Payer: Self-pay | Admitting: *Deleted

## 2016-10-27 NOTE — Telephone Encounter (Signed)
Patient's daughter notified of ultrasound at [facility] radiology. She was given the address and date and time. It is July the 5th at 7:45 AM. Nothing to eat or drink after midnight. Stephanie Swanson

## 2016-11-03 ENCOUNTER — Ambulatory Visit (HOSPITAL_COMMUNITY)
Admission: RE | Admit: 2016-11-03 | Discharge: 2016-11-03 | Disposition: A | Discharge status: Home / Self Care | Payer: Medicaid Other | Source: Ambulatory Visit | Attending: Internal Medicine | Admitting: Internal Medicine

## 2016-11-03 DIAGNOSIS — K8020 Calculus of gallbladder without cholecystitis without obstruction: Secondary | ICD-10-CM | POA: Diagnosis not present

## 2016-11-03 DIAGNOSIS — R932 Abnormal findings on diagnostic imaging of liver and biliary tract: Secondary | ICD-10-CM | POA: Insufficient documentation

## 2016-11-03 DIAGNOSIS — N2889 Other specified disorders of kidney and ureter: Secondary | ICD-10-CM | POA: Insufficient documentation

## 2016-11-03 DIAGNOSIS — B182 Chronic viral hepatitis C: Secondary | ICD-10-CM | POA: Diagnosis not present

## 2016-11-10 ENCOUNTER — Other Ambulatory Visit: Payer: Self-pay | Admitting: Internal Medicine

## 2016-11-10 DIAGNOSIS — K7460 Unspecified cirrhosis of liver: Secondary | ICD-10-CM

## 2016-11-17 ENCOUNTER — Telehealth: Payer: Self-pay | Admitting: *Deleted

## 2016-11-17 NOTE — Telephone Encounter (Signed)
Call placed to patient's daughter asking that she return my call as soon as possible. Per last pharmacy note from 10/07/16, patient's no longer has Medicaid and was advised by Onnie Boer to apply for [employer]. Patient needs an MRI and I would like to schedule this when convenient for patient. Started the [employer] prior authorization and Juliann Pulse, financial advocate is investigating if patient's medicaid is active or not. [name]

## 2016-11-17 NOTE — Telephone Encounter (Signed)
MRI approved, #[id number] and scheduled at [facility] Radiology dept for 11/28/16 at 3:30 pm. Nothing to eat or drink four hours prior to exam. Will mail a copy of the appt date, time, and details. Scheduling would like daughter to call [phone] and advise of language preference. [name]

## 2016-11-17 NOTE — Telephone Encounter (Signed)
Per Juliann Pulse, financial counselor the Medicaid is showing active. Will try to proceed with authorization. [name]

## 2016-11-18 NOTE — Telephone Encounter (Signed)
Patient needing an AM appointment for MRI due to daughter's work schedule.  RN rescheduled MRI appointment for 8 AM on Mon., July 30 at [facility] MRI for arrival at 7 :45 AM, NPO after midnight.  Daughter given directions to [facility] MRI location.  Daughter verbalized back the appointment information.

## 2016-11-28 ENCOUNTER — Other Ambulatory Visit (HOSPITAL_COMMUNITY): Payer: Medicaid Other

## 2016-11-28 ENCOUNTER — Ambulatory Visit (HOSPITAL_COMMUNITY)
Admission: RE | Admit: 2016-11-28 | Discharge: 2016-11-28 | Disposition: A | Discharge status: Home / Self Care | Payer: Medicaid Other | Source: Ambulatory Visit | Attending: Internal Medicine | Admitting: Internal Medicine

## 2016-11-28 DIAGNOSIS — R599 Enlarged lymph nodes, unspecified: Secondary | ICD-10-CM | POA: Insufficient documentation

## 2016-11-28 DIAGNOSIS — N281 Cyst of kidney, acquired: Secondary | ICD-10-CM | POA: Diagnosis not present

## 2016-11-28 DIAGNOSIS — R188 Other ascites: Secondary | ICD-10-CM | POA: Insufficient documentation

## 2016-11-28 DIAGNOSIS — K7460 Unspecified cirrhosis of liver: Secondary | ICD-10-CM | POA: Diagnosis not present

## 2016-11-28 DIAGNOSIS — J90 Pleural effusion, not elsewhere classified: Secondary | ICD-10-CM | POA: Insufficient documentation

## 2016-11-28 DIAGNOSIS — K8020 Calculus of gallbladder without cholecystitis without obstruction: Secondary | ICD-10-CM | POA: Insufficient documentation

## 2016-11-28 LAB — POCT I-STAT CREATININE: Creatinine, Ser: 0.6 mg/dL (ref 0.44–1.00)

## 2016-11-28 MED ORDER — GADOBENATE DIMEGLUMINE 529 MG/ML IV SOLN
20.0000 mL | Freq: Once | INTRAVENOUS | Status: AC | PRN
  Administered 2016-11-28: 16 mL via INTRAVENOUS

## 2017-01-03 ENCOUNTER — Other Ambulatory Visit: Payer: Medicaid Other

## 2017-01-03 DIAGNOSIS — B182 Chronic viral hepatitis C: Secondary | ICD-10-CM

## 2017-01-04 LAB — COMPLETE METABOLIC PANEL WITH GFR
ALT: 13 U/L (ref 6–29)
AST: 18 U/L (ref 10–35)
Albumin: 3.8 g/dL (ref 3.6–5.1)
Alkaline Phosphatase: 97 U/L (ref 33–130)
BILIRUBIN TOTAL: 0.4 mg/dL (ref 0.2–1.2)
BUN: 13 mg/dL (ref 7–25)
CALCIUM: 9.4 mg/dL (ref 8.6–10.4)
CHLORIDE: 104 mmol/L (ref 98–110)
CO2: 22 mmol/L (ref 20–32)
CREATININE: 0.61 mg/dL (ref 0.50–0.99)
GFR, Est Non African American: >89 mL/min (ref >=60)
Glucose, Bld: 88 mg/dL (ref 65–99)
Potassium: 4 mmol/L (ref 3.5–5.3)
Sodium: 139 mmol/L (ref 135–146)
TOTAL PROTEIN: 8.1 g/dL (ref 6.1–8.1)

## 2017-01-05 LAB — HEPATITIS C RNA QUANTITATIVE
HCV QUANT LOG: NOT DETECTED {Log_IU}/mL
HCV Quantitative: <15 IU/mL

## 2017-01-10 ENCOUNTER — Ambulatory Visit (INDEPENDENT_AMBULATORY_CARE_PROVIDER_SITE_OTHER): Payer: Medicaid Other | Admitting: Internal Medicine

## 2017-01-10 ENCOUNTER — Encounter: Payer: Self-pay | Admitting: Internal Medicine

## 2017-01-10 ENCOUNTER — Ambulatory Visit: Payer: Medicaid Other | Admitting: Internal Medicine

## 2017-01-10 VITALS — BP 156/86 | Temp 98.2°F | Wt 185.0 lb

## 2017-01-10 DIAGNOSIS — B182 Chronic viral hepatitis C: Secondary | ICD-10-CM

## 2017-01-10 DIAGNOSIS — K7460 Unspecified cirrhosis of liver: Secondary | ICD-10-CM | POA: Diagnosis not present

## 2017-01-10 NOTE — Assessment & Plan Note (Signed)
Completed and eot undetectable.  rtc 4 month for SVR 12

## 2017-01-10 NOTE — Assessment & Plan Note (Addendum)
This is a new problem noted since I last saw her. 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 Will do [place] screening prior to next visit I will refer her back to GI to see if and when she needs a repeat EGD if they can review it and schedule as needed.

## 2017-01-10 NOTE — Progress Notes (Signed)
   Subjective:    Patient ID: Stephanie Swanson, female    DOB: 26-Apr-1956, 61 y.o.   MRN: [id number]  HPI Here for follow up of HCV and cirrhosis.  Started and now finished Mavyret 12 weeks for genotype 4.  Some headache during treatment but not felt to be significant.  End of treatment lab without detection.  Elastography and then MRI notable for cirrhosis.  No alcohol.  Has previously seen GI and had an EGD and colonoscopy.  No mention of varices.  No associated rash.  She did have an episode of confusion last year but no definitive encephalopathy. Only trace ascites.      Review of Systems  Constitutional: Negative for fatigue.  Gastrointestinal: Negative for diarrhea.  Skin: Negative for rash.  Neurological: Negative for dizziness.       Objective:   Physical Exam  Constitutional: She appears well-developed and well-nourished. No distress.  HENT:  Mouth/Throat: No oropharyngeal exudate.  Eyes: No scleral icterus.  Cardiovascular: Normal rate, regular rhythm and normal heart sounds.   No murmur heard. Pulmonary/Chest: Effort normal and breath sounds normal. No respiratory distress.  Lymphadenopathy:    She has no cervical adenopathy.  Skin: No rash noted.   Sh: no alcohol or tobacco       Assessment & Plan:

## 2017-01-11 ENCOUNTER — Telehealth: Payer: Self-pay | Admitting: Gastroenterology

## 2017-01-13 NOTE — Telephone Encounter (Signed)
She is okay for a direct endoscopy. Thanks

## 2017-01-13 NOTE — Telephone Encounter (Signed)
Left message for patient to call back and schedule appointment with interpreter services.

## 2017-01-13 NOTE — Telephone Encounter (Signed)
Ok to schedule egd for esophageal varices screening.

## 2017-02-15 ENCOUNTER — Encounter: Payer: Self-pay | Admitting: Internal Medicine

## 2017-04-07 ENCOUNTER — Other Ambulatory Visit: Payer: Self-pay | Admitting: Internal Medicine

## 2017-04-07 ENCOUNTER — Telehealth: Payer: Self-pay | Admitting: *Deleted

## 2017-04-07 DIAGNOSIS — B1710 Acute hepatitis C without hepatic coma: Secondary | ICD-10-CM

## 2017-04-07 NOTE — Telephone Encounter (Signed)
Attempted to call patient via [employer] and no answer and no voice mail set up. Called back one hour later and the daughter, Reginold Agent answered the phone. Gave her appt information for ultrasound on 05/11/16 at 9:15 AM. Advised nothing to eat or drink after midnight. Also gave the info for follow up appt with Dr. Linus Salmons on 05/16/16 at 11:00 AM. I explained to daughter that Gastroenterology is trying to reach Mom as well regarding a follow up appt at that office. They reached out to her once in September 2018. I will ask Dr. Linus Salmons to route a message to GI MD to see if staff can try again to contact patient. Or we can wait until January, after her ultrasound. Thanks, [name] CMA

## 2017-05-09 ENCOUNTER — Telehealth: Payer: Self-pay | Admitting: *Deleted

## 2017-05-09 ENCOUNTER — Other Ambulatory Visit (INDEPENDENT_AMBULATORY_CARE_PROVIDER_SITE_OTHER): Payer: Medicaid Other

## 2017-05-09 DIAGNOSIS — B182 Chronic viral hepatitis C: Secondary | ICD-10-CM

## 2017-05-09 LAB — COMPLETE METABOLIC PANEL WITH GFR
AG Ratio: 1 (calc) (ref 1.0–2.5)
ALT: 17 U/L (ref 6–29)
AST: 23 U/L (ref 10–35)
Albumin: 4 g/dL (ref 3.6–5.1)
Alkaline phosphatase (APISO): 89 U/L (ref 33–130)
BUN: 8 mg/dL (ref 7–25)
CALCIUM: 9 mg/dL (ref 8.6–10.4)
CO2: 23 mmol/L (ref 20–32)
CREATININE: 0.56 mg/dL (ref 0.50–0.99)
Chloride: 104 mmol/L (ref 98–110)
GFR, EST AFRICAN AMERICAN: 116 mL/min/{1.73_m2} (ref >=60)
GFR, EST NON AFRICAN AMERICAN: 100 mL/min/{1.73_m2} (ref >=60)
GLUCOSE: 103 mg/dL — AB (ref 65–99)
Globulin: 4 g/dL (calc) — ABNORMAL HIGH (ref 1.9–3.7)
Potassium: 3.9 mmol/L (ref 3.5–5.3)
Sodium: 136 mmol/L (ref 135–146)
TOTAL PROTEIN: 8 g/dL (ref 6.1–8.1)
Total Bilirubin: 0.6 mg/dL (ref 0.2–1.2)

## 2017-05-09 NOTE — Telephone Encounter (Signed)
Patient came in today for her lab appt and I spoke to the daughter about patient's ultrasound appt on 05/11/17 at [facility]. Patients daughter stated she will bring her and I walked her across the hall to show her where to go. Her MD f/u appt she could not keep because the daughter has to work. Daughter stated that Monday appts were best due to her work schedule. Per Dr. Linus Salmons MD appt changed to appt with pharmacy for Monday 05/22/17. Daughter is aware that Mom needs to be fasting prior to ultrasound. [name] CMA

## 2017-05-09 NOTE — Progress Notes (Signed)
error 

## 2017-05-09 NOTE — Progress Notes (Signed)
Lab visit

## 2017-05-11 ENCOUNTER — Ambulatory Visit
Admission: RE | Admit: 2017-05-11 | Discharge: 2017-05-11 | Disposition: A | Discharge status: Home / Self Care | Payer: Medicaid Other | Source: Ambulatory Visit | Attending: Internal Medicine | Admitting: Internal Medicine

## 2017-05-11 DIAGNOSIS — K7460 Unspecified cirrhosis of liver: Secondary | ICD-10-CM

## 2017-05-11 LAB — HEPATITIS C RNA QUANTITATIVE
HCV Quantitative Log: <1.18 Log IU/mL
HCV RNA, PCR, QN: <15 IU/mL

## 2017-05-16 ENCOUNTER — Ambulatory Visit: Payer: Medicaid Other | Admitting: Internal Medicine

## 2017-05-22 ENCOUNTER — Encounter: Payer: Self-pay | Admitting: Internal Medicine

## 2017-05-22 ENCOUNTER — Ambulatory Visit (INDEPENDENT_AMBULATORY_CARE_PROVIDER_SITE_OTHER): Payer: Medicaid Other | Admitting: Pharmacist Clinician (PhC)/ Clinical Pharmacy Specialist

## 2017-05-22 DIAGNOSIS — B182 Chronic viral hepatitis C: Secondary | ICD-10-CM | POA: Diagnosis not present

## 2017-05-22 DIAGNOSIS — K769 Liver disease, unspecified: Secondary | ICD-10-CM | POA: Diagnosis not present

## 2017-05-22 MED ORDER — MECLIZINE HCL 25 MG PO TABS: 25.0000 mg | ORAL_TABLET | Freq: Three times a day (TID) | ORAL | 0 refills | Status: DC | PRN

## 2017-05-22 NOTE — Progress Notes (Signed)
HPI: Stephanie Swanson is a 62 y.o. female who is here to see pharmacy for the cure visit.   Lab Results  Component Value Date   HCVGENOTYPE 4ACD 06/07/2016   HEPCGENOTYPE 4 12/14/2015    Allergies: No Known Allergies  Vitals:    Past Medical History: Past Medical History:  Diagnosis Date  . Headache   . Hypertension     Social History: Social History   Socioeconomic History  . Marital status: Single    Spouse name: Not on file  . Number of children: 4  . Years of education: 0  . Highest education level: Not on file  Social Needs  . Financial resource strain: Not on file  . Food insecurity - worry: Not on file  . Food insecurity - inability: Not on file  . Transportation needs - medical: Not on file  . Transportation needs - non-medical: Not on file  Occupational History  . Not on file  Tobacco Use  . Smoking status: Never Smoker  . Smokeless tobacco: Never Used  Substance and Sexual Activity  . Alcohol use: No  . Drug use: No  . Sexual activity: Not on file  Other Topics Concern  . Not on file  Social History Narrative   Immigrant Social History:   - Name spelling correct?: yes   - Date arrived in [place]: 09/11/14   - Country of origin: [place]   - Location of refugee camp (if applicable), how long there, and what caused patient to leave home country?: was in refugee camp is [place]. Was there a long time. Left because of the war.   - Primary language: kinyarwanda   -Requires intepreter (essentially speaks no [place])   - Education: Highest level of education: no schooling   - Prior work: Psychologist, sport and exercise   - Games developer name and number: [id number]   - Tobacco/alcohol/drug use: no   - Marriage Status: widowed   - Sexual activity: no   - Were you beaten or tortured in your country or refugee camp? no   - if yes: Are you having bad dreams about your experience?    Do you feel "jumpy" or "nervous?"    Do you feel that the experience is happening again?    Are you "super alert" or watchful?     Labs: Hep B S Ab (no units)  Date Value  03/01/2016 NEG   Hepatitis B Surface Ag (no units)  Date Value  03/01/2016 NEGATIVE   HCV Ab (s/co ratio)  Date Value  12/13/2015 >11.0 (H)    Lab Results  Component Value Date   HCVGENOTYPE 4ACD 06/07/2016   HEPCGENOTYPE 4 12/14/2015    Hepatitis C RNA quantitative Latest Ref Rng & Units 05/09/2017 01/03/2017 08/05/2016  HCV Quantitative NOT DETECTED IU/mL - <15 NOT DETECTED <15 NOT DETECTED  HCV Quantitative Log NOT DETECT Log IU/mL <1.18 NOT DETECTED <1.18 NOT DETECTED <1.18 NOT DETECTED    AST (U/L)  Date Value  05/09/2017 23  01/03/2017 18  12/15/2015 50 (H)   ALT (U/L)  Date Value  05/09/2017 17  01/03/2017 13  06/07/2016 31 (H)  03/01/2016 33 (H)  12/15/2015 38   INR (no units)  Date Value  12/12/2015 1.18    CrCl: CrCl cannot be calculated (Unknown ideal weight.).  Fibrosis Score: F3/4 as assessed by ARFI  Child-Pugh Score: Class A  Previous Treatment Regimen: None  Assessment: Stephanie Swanson is now cured of her hep C. Her SVR24 came back negative. Dr. Linus Salmons ordered  an abdominal US in Jan and it showed a lesion on her liver. Therefore, Dr. Linus Salmons would like to repeat her MRI. Explained this to her and her daughter through a Optometrist. Her daughter speaks english well. An order was put in and will get Stephanie Swanson to start the prior authorization process. She does have active Medicaid. She prefers to get it done on a Monday morning if possible.   Explained to her that if the lesion needs further testing, a biopsy is possible. The MRI in July show underlying cirrhosis but not hypervascular hepatic lesions.   She complained of some dizziness. We will give her some meclizine.   Recommendations:  Cured of hep C MRI to evaluate lesion Meclizine 25mg  TID PRN x [address]  Pharm.D., BCPS, AAHIVP Clinical Infectious [place] for Infectious Disease 05/22/2017, 11:43 AM

## 2017-06-27 ENCOUNTER — Encounter: Payer: Self-pay | Admitting: Family Medicine

## 2017-06-27 ENCOUNTER — Other Ambulatory Visit: Payer: Self-pay

## 2017-06-27 ENCOUNTER — Ambulatory Visit: Payer: Medicaid Other | Admitting: Family Medicine

## 2017-06-27 VITALS — BP 152/70 | HR 71 | Temp 97.9°F | Ht 64.0 in | Wt 182.0 lb

## 2017-06-27 DIAGNOSIS — R1013 Epigastric pain: Secondary | ICD-10-CM

## 2017-06-27 DIAGNOSIS — K7460 Unspecified cirrhosis of liver: Secondary | ICD-10-CM

## 2017-06-27 DIAGNOSIS — G8929 Other chronic pain: Secondary | ICD-10-CM | POA: Diagnosis not present

## 2017-06-27 DIAGNOSIS — G44221 Chronic tension-type headache, intractable: Secondary | ICD-10-CM | POA: Diagnosis not present

## 2017-06-27 MED ORDER — TOPIRAMATE 50 MG PO TABS: 50.0000 mg | ORAL_TABLET | Freq: Two times a day (BID) | ORAL | 3 refills | Status: DC

## 2017-06-27 MED ORDER — OMEPRAZOLE 40 MG PO CPDR: 40.0000 mg | DELAYED_RELEASE_CAPSULE | Freq: Every day | ORAL | 3 refills | Status: DC

## 2017-06-27 NOTE — Patient Instructions (Signed)
It was good to see you again today.    Take the Topiramate daily for your headache.  Take the Omeprazole daily to help with your abdominal pain.    I have referred you to the Gastroenterologist.    Come back and see me in 3 months.

## 2017-06-27 NOTE — Progress Notes (Signed)
Subjective:   Language resources [place] interpreter Stephanie Swanson present today  Stephanie Swanson is a 62 y.o. female who presents to [facility] today for abdominal pain and headaches:  1.  Headaches:  Present most days.  Has been followed by neurology for this.  Prescribed Topamax but has been out of her medicines.  Pain has returned.  Describes dull frontal aching worse at night.  Most days of the week, not everyday.  No changes in vision.  No peripheral weakness or sensory changes.    2.  Abdominal pain:   Diffuse, generalized, persistent.   Has known cirrhosis.  Follows with ID.  Sounds like she has been referred several times to GI, but they were never able to keep appt due to language barrier.  Worse after food and describes burning sensation in chest after meals.  No weight changes.  No diarrhea or constipation.     ROS as above per HPI.    The following portions of the patient's history were reviewed and updated as appropriate: allergies, current medications, past medical history, family and social history, and problem list. Patient is a nonsmoker.    PMH reviewed.  Past Medical History:  Diagnosis Date  . Headache   . Hypertension    No past surgical history on file.  Medications reviewed. Current Outpatient Medications  Medication Sig Dispense Refill  . meclizine (ANTIVERT) 25 MG tablet Take 1 tablet (25 mg total) by mouth 3 (three) times daily as needed for dizziness. 30 tablet 0  . topiramate (TOPAMAX) 50 MG tablet Take 1 tablet (50 mg total) by mouth 2 (two) times daily. (Patient not taking: Reported on 01/10/2017) 60 tablet 0   No current facility-administered medications for this visit.      Objective:   Physical Exam BP (!) 152/70   Pulse 71   Temp 97.9 F (36.6 C) (Oral)   Ht 5\' 4"  (1.626 m)   Wt 182 lb (82.6 kg)   SpO2 98%   BMI 31.24 kg/m  Gen:  Alert, cooperative patient who appears stated age in no acute distress.  Vital signs reviewed. HEENT: EOMI,   MMM.  No papilledema Cardiac:  Regular rate and rhythm without murmur auscultated.  Good S1/S2. Pulm:  Clear to auscultation bilaterally with good air movement.  No wheezes or rales noted.   Abd:  Soft/nondistended/mildly TTP in epigastrum and BL upper quadrants Exts: Non edematous BL  LE, warm and well perfused.  Neuro:  Strength and sensation fully intact BL upper and lower extremities.   Imp/Plan: 1.  Chronic headaches:  - restart topamax - continue to follow with neurology - OTC analgesics for relief  2.  Abd pain:  - diffuse, generalized. - vague symptoms, though does have some discrete symptoms of GERD - starting PPI today - she has had positive h pylori that was treated in past - possibility this was incompletely treated.   - Referring to GI today.  We made appt here with interpreter present.   - known cirrhosis.  She had [place] just last month.  No evidence of ascites.

## 2017-06-29 ENCOUNTER — Encounter: Payer: Self-pay | Admitting: Family Medicine

## 2017-07-06 ENCOUNTER — Encounter: Payer: Self-pay | Admitting: Family Medicine

## 2017-08-07 ENCOUNTER — Encounter: Payer: Self-pay | Admitting: Adult Health

## 2017-08-10 ENCOUNTER — Ambulatory Visit: Payer: Medicaid Other | Admitting: Adult Health

## 2017-12-06 ENCOUNTER — Ambulatory Visit: Payer: Medicaid Other | Admitting: Adult Health

## 2017-12-06 ENCOUNTER — Encounter: Payer: Self-pay | Admitting: Adult Health

## 2017-12-06 ENCOUNTER — Encounter

## 2017-12-06 VITALS — BP 144/66 | HR 75 | Ht 64.0 in | Wt 182.8 lb

## 2017-12-06 DIAGNOSIS — G43019 Migraine without aura, intractable, without status migrainosus: Secondary | ICD-10-CM | POA: Diagnosis not present

## 2017-12-06 MED ORDER — TOPIRAMATE 50 MG PO TABS: 50.0000 mg | ORAL_TABLET | Freq: Two times a day (BID) | ORAL | 11 refills | Status: DC

## 2017-12-06 NOTE — Patient Instructions (Signed)
Your Plan:  Restart Topamax 50 mg twice a day If your symptoms worsen or you develop new symptoms please let us know.   Thank you for coming to see [place] at [facility]. I hope we have been able to provide you high quality care today.  You may receive a patient satisfaction survey over the next few weeks. We would appreciate your feedback and comments so that we may continue to improve ourselves and the health of our patients.

## 2017-12-06 NOTE — Progress Notes (Signed)
PATIENT: Stephanie Swanson DOB: Apr 12, 1956  REASON FOR VISIT: follow up HISTORY FROM: patient,daughter, interpreter present  HISTORY OF PRESENT ILLNESS: Today 12/06/17:  Stephanie Swanson  is a 62 year old female with a history of migraine headaches.  She returns today for follow-up.  At the last visit she was told to restart Topamax.  She states that she has not taken this medication in over a year.  She apparently ran out of the prescription?  Her daughter questions if the medication was beneficial.  She states that she has a daily headache.  She states that it typically starts in the face and then moves all over the head.  She feels like her headaches have remained stable.  She returns today for evaluation.  HISTORY 08/08/16 (Copied from Dr. Edwena Felty note): Stephanie Swanson. Is seen here today for a revisit. She was recently diagnosed with hepatitis C and started on an oral antiviral medication. She was told that she cannot take other medications with it without a doctor's approval. I explained that topiramate is not metabolized through the liver and should not interfere with his medication, and the patient has now been off for over a month. I would like her to resume topiramate at 50 mg tablets and he may have to start this 50 mg twice a day and in a couple of weeks increase again to 75 mg twice a day. She has had more headaches after TPM was discontinued.     REVIEW OF SYSTEMS: Out of a complete 14 system review of symptoms, the patient complains only of the following symptoms, and all other reviewed systems are negative.  See HPI 1.  Migraine headaches  ALLERGIES: Not on File  HOME MEDICATIONS: Outpatient Medications Prior to Visit  Medication Sig Dispense Refill  . meclizine (ANTIVERT) 25 MG tablet Take 1 tablet (25 mg total) by mouth 3 (three) times daily as needed for dizziness. 30 tablet 0  . omeprazole (PRILOSEC) 40 MG capsule Take 1 capsule (40 mg total) by mouth daily.  30 capsule 3  . topiramate (TOPAMAX) 50 MG tablet Take 1 tablet (50 mg total) by mouth 2 (two) times daily. 60 tablet 3   No facility-administered medications prior to visit.     PAST MEDICAL HISTORY: Past Medical History:  Diagnosis Date  . Headache   . Hypertension     PAST SURGICAL HISTORY: No past surgical history on file.  FAMILY HISTORY: No family history on file.  SOCIAL HISTORY: Social History   Socioeconomic History  . Marital status: Single    Spouse name: Not on file  . Number of children: 4  . Years of education: 0  . Highest education level: Not on file  Occupational History  . Not on file  Social Needs  . Financial resource strain: Not on file  . Food insecurity:    Worry: Not on file    Inability: Not on file  . Transportation needs:    Medical: Not on file    Non-medical: Not on file  Tobacco Use  . Smoking status: Never Smoker  . Smokeless tobacco: Never Used  Substance and Sexual Activity  . Alcohol use: No  . Drug use: No  . Sexual activity: Not on file  Lifestyle  . Physical activity:    Days per week: Not on file    Minutes per session: Not on file  . Stress: Not on file  Relationships  . Social connections:    Talks on phone: Not on  file    Gets together: Not on file    Attends religious service: Not on file    Active member of club or organization: Not on file    Attends meetings of clubs or organizations: Not on file    Relationship status: Not on file  . Intimate partner violence:    Fear of current or ex partner: Not on file    Emotionally abused: Not on file    Physically abused: Not on file    Forced sexual activity: Not on file  Other Topics Concern  . Not on file  Social History Narrative   Immigrant Social History:   - Name spelling correct?: yes   - Date arrived in [place]: 09/11/14   - Country of origin: [place]   - Location of refugee camp (if applicable), how long there, and what caused patient to leave home country?:  was in refugee camp is [place]. Was there a long time. Left because of the war.   - Primary language: kinyarwanda   -Requires intepreter (essentially speaks no [place])   - Education: Highest level of education: no schooling   - Prior work: Psychologist, sport and exercise   - Games developer name and number: [id number]   - Tobacco/alcohol/drug use: no   - Marriage Status: widowed   - Sexual activity: no   - Were you beaten or tortured in your country or refugee camp? no   - if yes: Are you having bad dreams about your experience?    Do you feel "jumpy" or "nervous?"    Do you feel that the experience is happening again?    Are you "super alert" or watchful?       PHYSICAL EXAM  Vitals:   12/06/17 1330  Weight: 182 lb 12.8 oz (82.9 kg)  Height: 5\' 4"  (1.626 m)   Body mass index is 31.38 kg/m.  Generalized: Well developed, in no acute distress   Neurological examination  Mentation: Alert . Follows all commands speech and language fluent Cranial nerve II-XII: Pupils were equal round reactive to light. Extraocular movements were full, visual field were full on confrontational test. Facial sensation and strength were normal. Uvula tongue midline. Head turning and shoulder shrug  were normal and symmetric. Motor: The motor testing reveals 5 over 5 strength of all 4 extremities. Good symmetric motor tone is noted throughout.  Sensory: Sensory testing is intact to soft touch on all 4 extremities. No evidence of extinction is noted.  Coordination: Cerebellar testing reveals good finger-nose-finger and heel-to-shin bilaterally.  Gait and station: Gait is normal.  Reflexes: Deep tendon reflexes are symmetric and normal bilaterally.   DIAGNOSTIC DATA (LABS, IMAGING, TESTING) - I reviewed patient records, labs, notes, testing and imaging myself where available.  Lab Results  Component Value Date   WBC 7.3  12/14/2015   HGB 13.8 12/14/2015   HCT 41.9 12/14/2015   MCV 91.9 12/14/2015   PLT 183 12/14/2015      Component Value Date/Time   NA 136 05/09/2017 1211   K 3.9 05/09/2017 1211   CL 104 05/09/2017 1211   CO2 23 05/09/2017 1211   GLUCOSE 103 (H) 05/09/2017 1211   BUN 8 05/09/2017 1211   CREATININE 0.56 05/09/2017 1211   CALCIUM 9.0 05/09/2017 1211   PROT 8.0 05/09/2017 1211   ALBUMIN 3.8 01/03/2017 1009   AST 23 05/09/2017 1211   ALT 17 05/09/2017 1211   ALT 31 (H) 06/07/2016 1436   ALKPHOS 97 01/03/2017 1009   BILITOT 0.6  05/09/2017 1211   GFRNONAA 100 05/09/2017 1211   GFRAA 116 05/09/2017 1211   Lab Results  Component Value Date   CHOL 131 12/13/2015   HDL 40 (L) 12/13/2015   LDLCALC 78 12/13/2015   TRIG 64 12/13/2015   CHOLHDL 3.3 12/13/2015   Lab Results  Component Value Date   HGBA1C 5.4 12/13/2015   Lab Results  Component Value Date   VITAMINB12 307 12/14/2015   Lab Results  Component Value Date   TSH 0.140 (L) 12/13/2015      ASSESSMENT AND PLAN 62 y.o. year old female  has a past medical history of Headache and Hypertension. here with:   1.  Migraine headache  I have instructed the patient to restart Topamax 50 mg twice a day.  If she does not find this beneficial for her headaches she should call our office and we will consider increasing her dose.  She will follow-up in 6 months or sooner if needed.   I spent 15 minutes with the patient. 50% of this time was spent discussing plan of care   Ward Givens, MSN, NP-C 12/06/2017, 1:48 PM [facility] [address], [place], [place] [address] [phone]

## 2018-01-03 NOTE — Progress Notes (Signed)
I agree with the assessment and plan as directed by NP .The patient is known to me .   Dejha King, MD  

## 2018-04-17 ENCOUNTER — Ambulatory Visit (INDEPENDENT_AMBULATORY_CARE_PROVIDER_SITE_OTHER): Payer: Medicaid Other | Admitting: Family Medicine

## 2018-04-17 ENCOUNTER — Encounter: Payer: Self-pay | Admitting: Family Medicine

## 2018-04-17 ENCOUNTER — Other Ambulatory Visit: Payer: Self-pay

## 2018-04-17 VITALS — BP 148/78 | HR 77 | Temp 97.6°F | Ht 64.0 in | Wt 182.4 lb

## 2018-04-17 DIAGNOSIS — K7460 Unspecified cirrhosis of liver: Secondary | ICD-10-CM | POA: Diagnosis not present

## 2018-04-17 DIAGNOSIS — R1030 Lower abdominal pain, unspecified: Secondary | ICD-10-CM

## 2018-04-17 DIAGNOSIS — Z23 Encounter for immunization: Secondary | ICD-10-CM

## 2018-04-17 DIAGNOSIS — B182 Chronic viral hepatitis C: Secondary | ICD-10-CM

## 2018-04-17 DIAGNOSIS — G8929 Other chronic pain: Secondary | ICD-10-CM | POA: Diagnosis not present

## 2018-04-17 DIAGNOSIS — R1013 Epigastric pain: Secondary | ICD-10-CM

## 2018-04-17 DIAGNOSIS — Z1239 Encounter for other screening for malignant neoplasm of breast: Secondary | ICD-10-CM

## 2018-04-17 LAB — POCT URINALYSIS DIP (MANUAL ENTRY)
Bilirubin, UA: NEGATIVE
Blood, UA: NEGATIVE
Glucose, UA: NEGATIVE mg/dL
Ketones, POC UA: NEGATIVE mg/dL
LEUKOCYTES UA: NEGATIVE
Nitrite, UA: NEGATIVE
Protein Ur, POC: NEGATIVE mg/dL
Spec Grav, UA: 1.025 (ref 1.010–1.025)
Urobilinogen, UA: 0.2 E.U./dL
pH, UA: 6 (ref 5.0–8.0)

## 2018-04-17 NOTE — Progress Notes (Signed)
Subjective:   Language resources Swahili interpreter Stephanie Swanson present for entire exam:    Stephanie Swanson is a 62 y.o. female who presents to [facility] today for chronic abdominal pain:  1.  Chronic abdominal pain:  Persists.  No real improvement.  Reports everyday.  Mostly epigastric, though occasionally BL lower quadrants as well.  No dysuria.  Unclear if urinary hesitancy or frequency.  Weights have been fairly stable. Can eat at times, but then goes several days without eating due to pain.  Food doesn't make the pain better or worse.  NO fevers/chills.  No diarrhea/constipation.   She has been referred to GI in past.  Unable to keep appt for EGD.  Will place referral today.  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  Recent RUQ [place] with 9 mm nodule in lobe of liver -- but her symptoms have long predated that [place].    Has ongoing nausea without any overt vomiting.     ROS as above per HPI.   The following portions of the patient's history were reviewed and updated as appropriate: allergies, current medications, past medical history, family and social history, and problem list. Patient is a nonsmoker.    PMH reviewed.  Past Medical History:  Diagnosis Date  . Headache   . Hypertension    No past surgical history on file.  Medications reviewed. Current Outpatient Medications  Medication Sig Dispense Refill  . meclizine (ANTIVERT) 25 MG tablet Take 1 tablet (25 mg total) by mouth 3 (three) times daily as needed for dizziness. 30 tablet 0  . omeprazole (PRILOSEC) 40 MG capsule Take 1 capsule (40 mg total) by mouth daily. 30 capsule 3  . topiramate (TOPAMAX) 50 MG tablet Take 1 tablet (50 mg total) by mouth 2 (two) times daily. 60 tablet 11   No current facility-administered medications for this visit.      Objective:   Physical Exam BP (!) 148/78   Pulse 77   Temp 97.6 F (36.4 C) (Oral)   Ht 5\' 4"  (1.626 m)   Wt 182 lb 6.4 oz (82.7 kg)   SpO2 96%   BMI 31.31 kg/m   Gen:  Alert, cooperative patient who appears stated age in no acute distress.  Vital signs reviewed. HEENT: EOMI,  MMM.  No conjunctival pallor or scleral icterus.  Cardiac:  Regular rate and rhythm without murmur auscultated.  Good S1/S2. Pulm:  Clear to auscultation bilaterally with good air movement.  No wheezes or rales noted.   Abd:  Soft/nondistended.  TTP along BL upper quadrants and epigastrum.  Mild TTP to deep palpation BL lower quadrants.  No guarding or rebound.  Hypoactive BS. Exts: Non edematous BL  LE, warm and well perfused.   No results found for this or any previous visit (from the past 72 hour(s)).

## 2018-04-17 NOTE — Patient Instructions (Addendum)
It was good to see you again today  I have referred you to the GI doctor.  They will call to schedule an appointment.  Make sure to keep that appointment.   We are checking blood and urine today.    Flu shot today.    Come back for a pap smear in 1 month.

## 2018-04-18 ENCOUNTER — Encounter: Payer: Self-pay | Admitting: Family Medicine

## 2018-04-18 LAB — COMPREHENSIVE METABOLIC PANEL
ALBUMIN: 4 g/dL (ref 3.6–4.8)
ALT: 19 IU/L (ref 0–32)
AST: 24 IU/L (ref 0–40)
Albumin/Globulin Ratio: 1 — ABNORMAL LOW (ref 1.2–2.2)
Alkaline Phosphatase: 97 IU/L (ref 39–117)
BUN / CREAT RATIO: 10 — AB (ref 12–28)
BUN: 7 mg/dL — AB (ref 8–27)
Bilirubin Total: 0.5 mg/dL (ref 0.0–1.2)
CALCIUM: 9.6 mg/dL (ref 8.7–10.3)
CO2: 23 mmol/L (ref 20–29)
CREATININE: 0.67 mg/dL (ref 0.57–1.00)
Chloride: 103 mmol/L (ref 96–106)
GFR, EST AFRICAN AMERICAN: 109 mL/min/{1.73_m2} (ref >59)
GFR, EST NON AFRICAN AMERICAN: 95 mL/min/{1.73_m2} (ref >59)
GLOBULIN, TOTAL: 4.2 g/dL (ref 1.5–4.5)
GLUCOSE: 88 mg/dL (ref 65–99)
Potassium: 4.2 mmol/L (ref 3.5–5.2)
Sodium: 142 mmol/L (ref 134–144)
TOTAL PROTEIN: 8.2 g/dL (ref 6.0–8.5)

## 2018-04-18 LAB — TSH: TSH: 0.479 u[IU]/mL (ref 0.450–4.500)

## 2018-04-18 LAB — CBC WITH DIFFERENTIAL/PLATELET
Basophils Absolute: 0 10*3/uL (ref 0.0–0.2)
Basos: 0 %
EOS (ABSOLUTE): 0.2 10*3/uL (ref 0.0–0.4)
EOS: 4 %
HEMATOCRIT: 45 % (ref 34.0–46.6)
HEMOGLOBIN: 15.1 g/dL (ref 11.1–15.9)
IMMATURE GRANS (ABS): 0 10*3/uL (ref 0.0–0.1)
IMMATURE GRANULOCYTES: 0 %
LYMPHS ABS: 2.7 10*3/uL (ref 0.7–3.1)
LYMPHS: 44 %
MCH: 29.7 pg (ref 26.6–33.0)
MCHC: 33.6 g/dL (ref 31.5–35.7)
MCV: 88 fL (ref 79–97)
MONOCYTES: 9 %
Monocytes Absolute: 0.6 10*3/uL (ref 0.1–0.9)
Neutrophils Absolute: 2.7 10*3/uL (ref 1.4–7.0)
Neutrophils: 43 %
Platelets: 194 10*3/uL (ref 150–450)
RBC: 5.09 x10E6/uL (ref 3.77–5.28)
RDW: 12.4 % (ref 12.3–15.4)
WBC: 6.2 10*3/uL (ref 3.4–10.8)

## 2018-04-18 LAB — LIPID PANEL
CHOL/HDL RATIO: 4.4 ratio (ref 0.0–4.4)
Cholesterol, Total: 175 mg/dL (ref 100–199)
HDL: 40 mg/dL (ref >39)
LDL CALC: 108 mg/dL — AB (ref 0–99)
Triglycerides: 133 mg/dL (ref 0–149)
VLDL CHOLESTEROL CAL: 27 mg/dL (ref 5–40)

## 2018-04-18 NOTE — Assessment & Plan Note (Signed)
Agree that [place] screening is good idea. - Hasn't seen GI.  - Referral placed today. - Do not think this nor liver nodularity contributing to her chronic pain.

## 2018-04-18 NOTE — Assessment & Plan Note (Signed)
Following with Dr. Linus Salmons of infectious dz.  Last viral load undetectable.

## 2018-04-18 NOTE — Assessment & Plan Note (Addendum)
Chronic.   Referral to GI today.   No clear etiology for her pain.  Ongoing.   Trial of simethicone for some sx relief.

## 2018-05-24 ENCOUNTER — Ambulatory Visit: Payer: Medicaid Other | Admitting: Family Medicine

## 2018-06-04 ENCOUNTER — Encounter: Payer: Self-pay | Admitting: Family Medicine

## 2018-06-04 ENCOUNTER — Other Ambulatory Visit: Payer: Self-pay

## 2018-06-04 ENCOUNTER — Ambulatory Visit: Payer: Medicaid Other | Admitting: Family Medicine

## 2018-06-04 ENCOUNTER — Other Ambulatory Visit (HOSPITAL_COMMUNITY)
Admission: RE | Admit: 2018-06-04 | Discharge: 2018-06-04 | Disposition: A | Discharge status: Home / Self Care | Payer: Medicaid Other | Source: Ambulatory Visit | Attending: Family Medicine | Admitting: Family Medicine

## 2018-06-04 VITALS — BP 130/80 | HR 77 | Temp 97.8°F | Ht 64.0 in | Wt 182.0 lb

## 2018-06-04 DIAGNOSIS — Z124 Encounter for screening for malignant neoplasm of cervix: Secondary | ICD-10-CM | POA: Insufficient documentation

## 2018-06-04 NOTE — Progress Notes (Signed)
   CC: Pap smear  HPI  Daughter interprets [place] telephone interpreter today, patient's native language not available on [place] interpreters today.  Patient does not have any pelvic pain or complaints.  She wanted a female provider to perform her Pap smear.  Daughter thinks she may have had one Pap smear in the past but we are not certain.  ROS: Denies CP, SOB, abdominal pain, dysuria, changes in BMs.   CC, SH/smoking status, and VS noted  Objective: BP 130/80   Pulse 77   Temp 97.8 F (36.6 C) (Oral)   Ht 5\' 4"  (1.626 m)   Wt 182 lb (82.6 kg)   SpO2 97%   BMI 31.24 kg/m  Gen: NAD, alert, cooperative, and pleasant. HEENT: NCAT, EOMI, PERRL CV: RRR, no murmur Resp: CTAB, no wheezes, non-labored GU: normal external female genitalia, nontender adnexa, cervix easily visualized.  Ext: No edema, warm Neuro: Alert and oriented, Speech clear, No gross deficits  Assessment and plan:  HM - pap smear completed today, resulted with adequate sample and no concerning findings. Patient will be sent letter.   Ralene Ok, MD, PGY3 06/07/2018 8:11 AM

## 2018-06-05 LAB — CYTOLOGY - PAP: Diagnosis: NEGATIVE

## 2018-06-06 ENCOUNTER — Encounter: Payer: Self-pay | Admitting: Family Medicine

## 2018-06-13 ENCOUNTER — Ambulatory Visit: Payer: Medicaid Other | Admitting: Neurology

## 2018-06-14 ENCOUNTER — Encounter: Payer: Self-pay | Admitting: Neurology

## 2018-07-25 IMAGING — CT CT HEAD CODE STROKE
3 of 4 series · 17 of 47 positions shown, 20 images · non-contrast
Comparison: CT of the head performed 10/15/2014

CLINICAL DATA: Code stroke. Patient found unresponsive, with
altered mental status. Initial encounter.

EXAM:
CT HEAD WITHOUT CONTRAST
TECHNIQUE: Contiguous axial images were obtained from the base of the skull
through the vertex without intravenous contrast.

[Series 201: head w/o, idose (1) · axial · non-contrast · 0.46mm/px · z∈[+86,+211]mm · 11 of 31 slices shown, 14 images]
[im 3/31  brain]
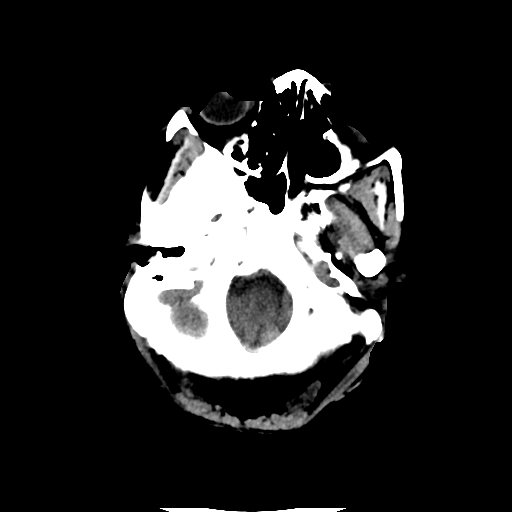
[im 3/31  bone]
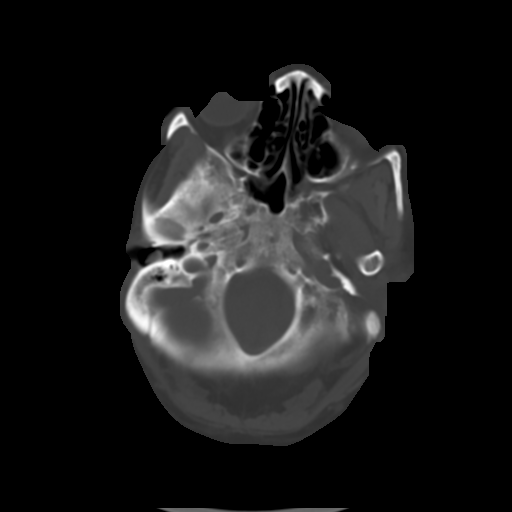
[im 5/31  brain]
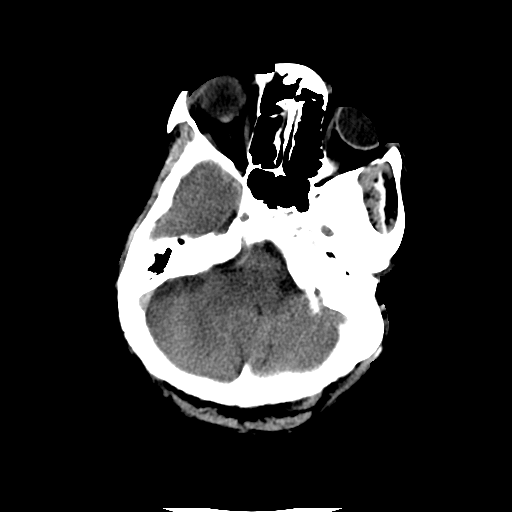
[im 7/31  brain]
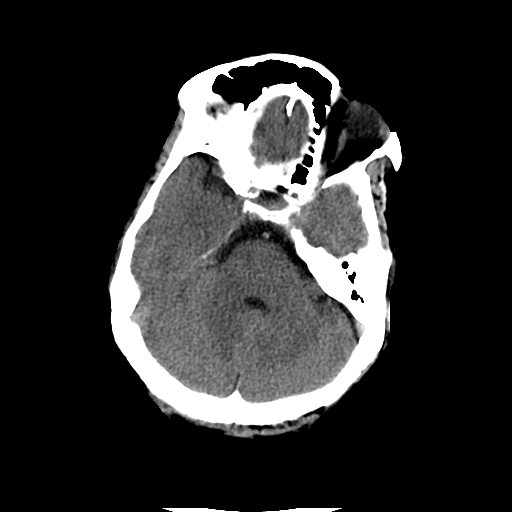
[im 11/31  brain]
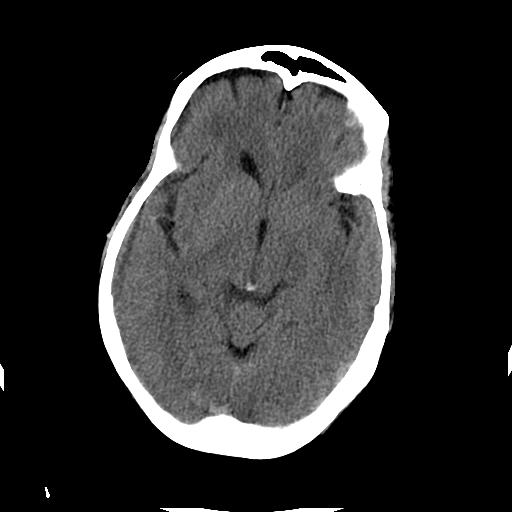
[im 13/31  brain]
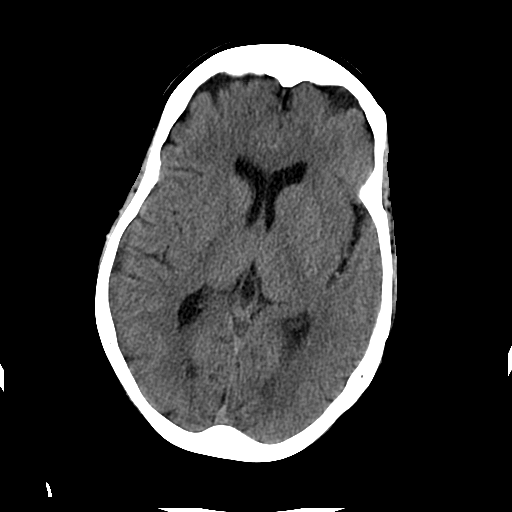
[im 13/31  bone]
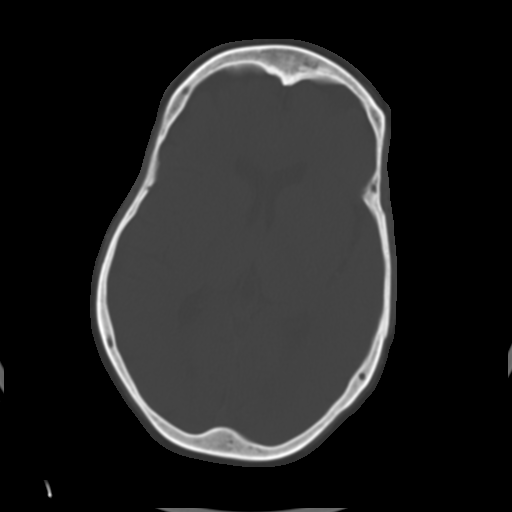
[im 16/31  brain]
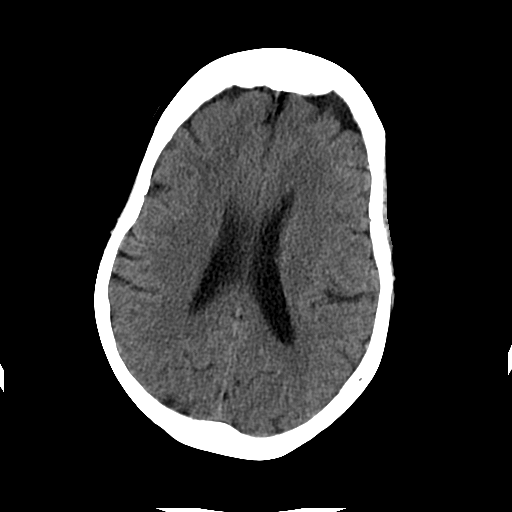
[im 18/31  brain]
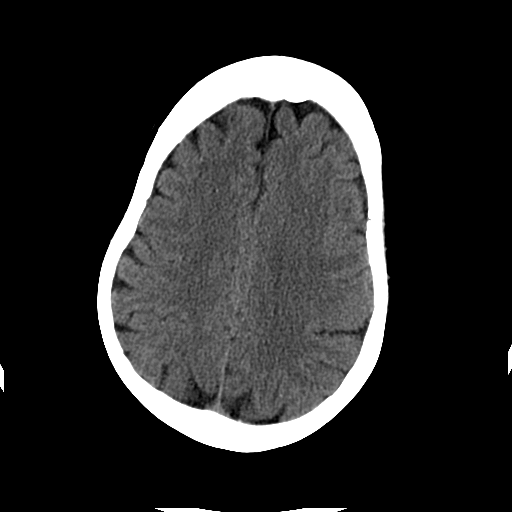
[im 20/31  brain]
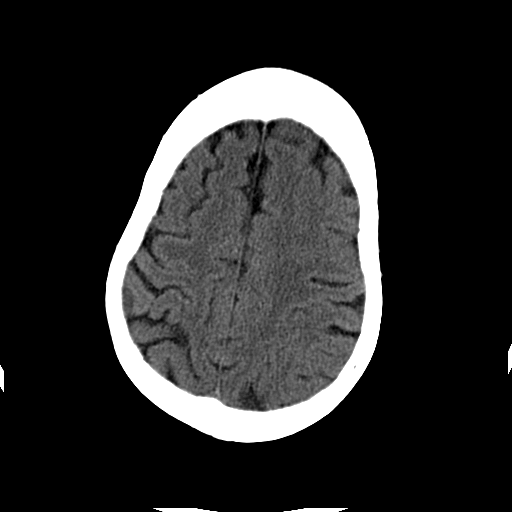
[im 24/31  brain]
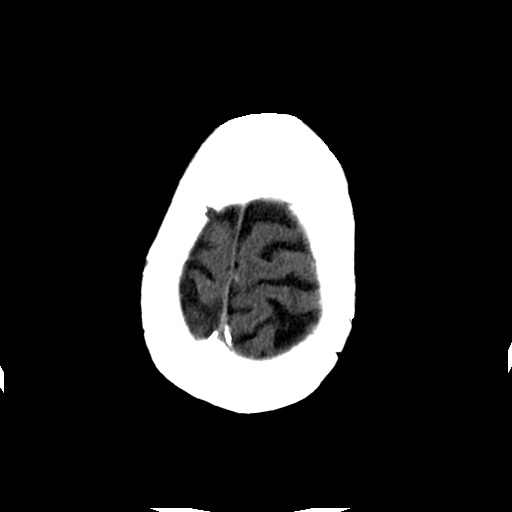
[im 24/31  bone]
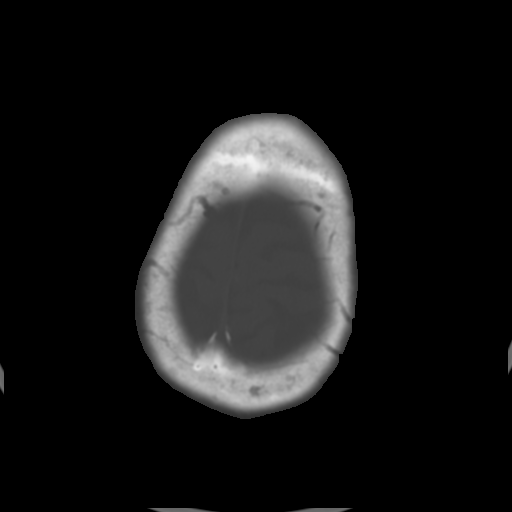
[im 26/31  brain]
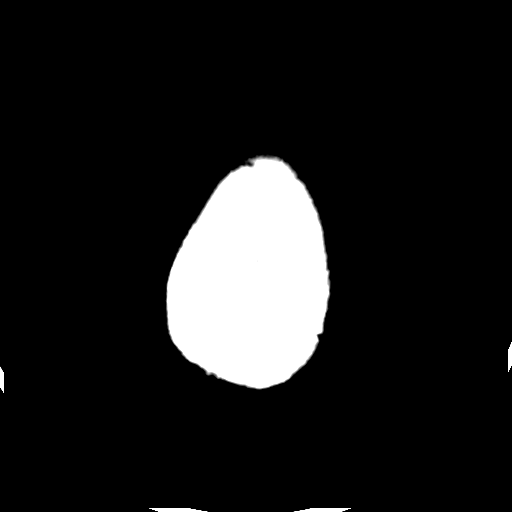
[im 28/31  brain]
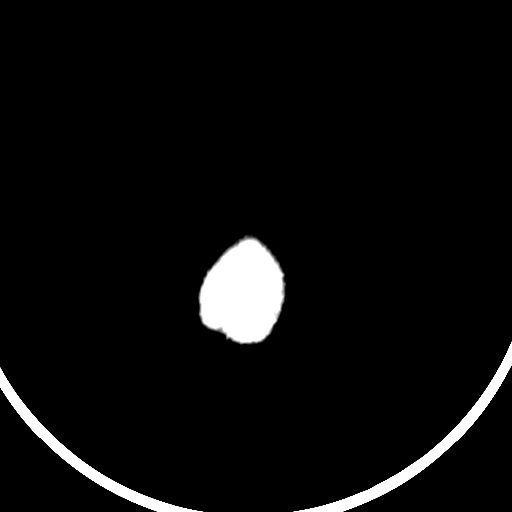

[Series 203: coronal st, idose (1) · coronal · 0.40mm/px · 3 of 73 slices shown]
[im 25/73  brain]
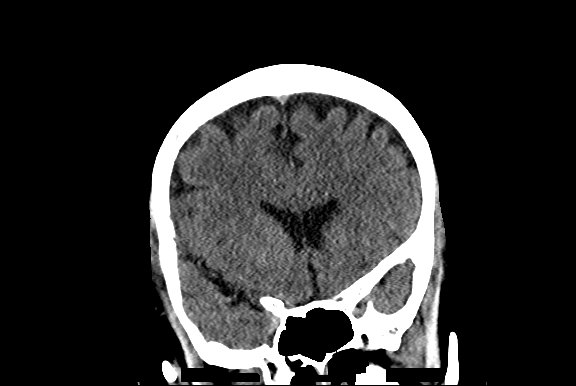
[im 33/73  brain]
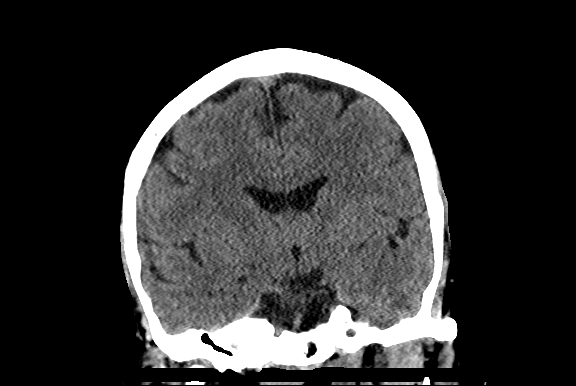
[im 41/73  brain]
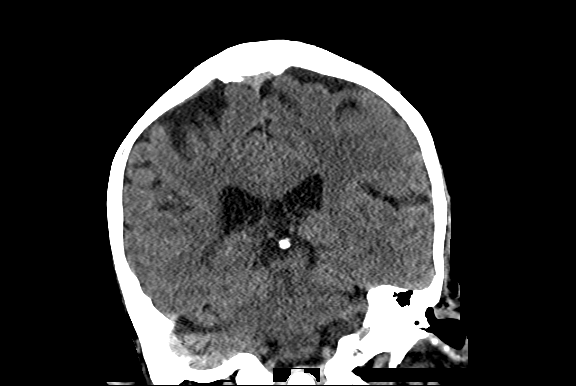

[Series 204: sagittal st, idose (1) · sagittal · 0.40mm/px · 3 of 77 slices shown]
[im 26/77  brain]
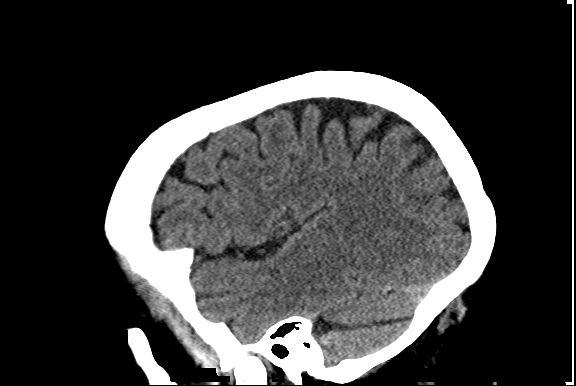
[im 39/77  brain]
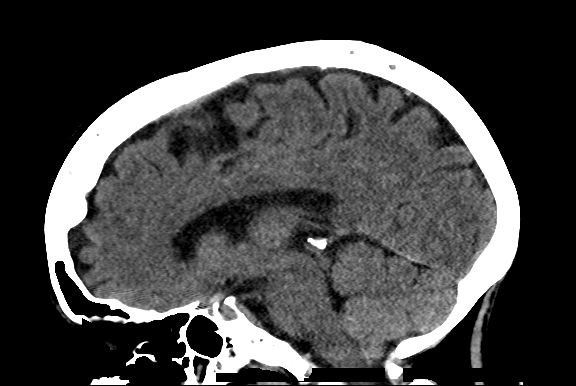
[im 51/77  brain]
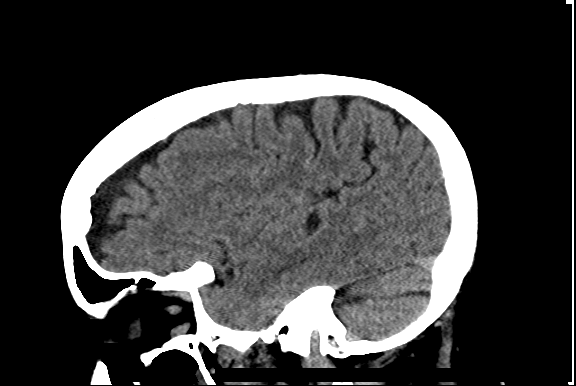

[17 of 47 positions shown; findings below may reference images not displayed]

FINDINGS: There is no evidence of acute infarction, mass lesion, or intra- or
extra-axial hemorrhage on CT.

A likely small chronic lacunar infarct is noted at the left external
capsule.

The posterior fossa, including the cerebellum, brainstem and fourth
ventricle, is within normal limits. The third and lateral ventricles
are unremarkable in appearance. The cerebral hemispheres are
symmetric in appearance, with normal gray-white differentiation. No
mass effect or midline shift is seen.

There is no evidence of fracture; visualized osseous structures are
unremarkable in appearance. The visualized portions of the orbits
are within normal limits. The paranasal sinuses and mastoid air
cells are well-aerated. No significant soft tissue abnormalities are
seen.

ASPECTS (Alberta Stroke Program Early CT Score,
[URL]

- Ganglionic level infarction (caudate, lentiform nuclei, internal
capsule, insula, M1-M3 cortex): 7

- Supraganglionic infarction (M4-M6 cortex): 3

Total score (0-10 with 10 being normal): 10
IMPRESSION: 1. No acute intracranial pathology seen on CT.
2. Likely small chronic lacunar infarct at the left external
capsule.
3. ASPECTS score 10
These results were called by telephone at the time of interpretation
on 12/12/2015 at [DATE] to Dr. Yan Nick, who verbally acknowledged
these results.

## 2019-12-11 ENCOUNTER — Other Ambulatory Visit: Payer: Self-pay

## 2019-12-11 ENCOUNTER — Ambulatory Visit: Payer: Medicaid Other | Admitting: Family Medicine

## 2019-12-11 ENCOUNTER — Encounter: Payer: Self-pay | Admitting: Family Medicine

## 2019-12-11 ENCOUNTER — Ambulatory Visit (HOSPITAL_COMMUNITY)
Admission: RE | Admit: 2019-12-11 | Discharge: 2019-12-11 | Disposition: A | Discharge status: Home / Self Care | Payer: Medicaid Other | Source: Ambulatory Visit | Attending: Family Medicine | Admitting: Family Medicine

## 2019-12-11 VITALS — BP 150/98 | HR 81 | Temp 98.3°F | Wt 175.0 lb

## 2019-12-11 DIAGNOSIS — G43011 Migraine without aura, intractable, with status migrainosus: Secondary | ICD-10-CM | POA: Diagnosis not present

## 2019-12-11 DIAGNOSIS — R109 Unspecified abdominal pain: Secondary | ICD-10-CM

## 2019-12-11 DIAGNOSIS — R55 Syncope and collapse: Secondary | ICD-10-CM | POA: Insufficient documentation

## 2019-12-11 DIAGNOSIS — G8929 Other chronic pain: Secondary | ICD-10-CM

## 2019-12-11 DIAGNOSIS — E049 Nontoxic goiter, unspecified: Secondary | ICD-10-CM | POA: Diagnosis not present

## 2019-12-11 DIAGNOSIS — Z1231 Encounter for screening mammogram for malignant neoplasm of breast: Secondary | ICD-10-CM | POA: Diagnosis not present

## 2019-12-11 DIAGNOSIS — G43709 Chronic migraine without aura, not intractable, without status migrainosus: Secondary | ICD-10-CM | POA: Diagnosis not present

## 2019-12-11 DIAGNOSIS — G450 Vertebro-basilar artery syndrome: Secondary | ICD-10-CM

## 2019-12-11 DIAGNOSIS — Z0000 Encounter for general adult medical examination without abnormal findings: Secondary | ICD-10-CM

## 2019-12-11 LAB — POCT GLYCOSYLATED HEMOGLOBIN (HGB A1C): Hemoglobin A1C: 5.2 % (ref 4.0–5.6)

## 2019-12-11 MED ORDER — DULOXETINE HCL 20 MG PO CPEP: 20.0000 mg | ORAL_CAPSULE | Freq: Every day | ORAL | 1 refills | Status: DC

## 2019-12-11 NOTE — Progress Notes (Signed)
SUBJECTIVE:   CHIEF COMPLAINT / HPI: "need disability papers"  [place] in person interpreter, Verdie Drown, was present for the duration of visit.  Stephanie Swanson is a 64 year old female presenting with her daughter and interpreter to discuss multiple concerns including the following:  Headaches: Hurts mainly on the top of her head, feels like it will come to her bilateral temples and make her face hurt.  Can see some bright spots in light prior to headache onset.  Photo/phonophobia.  Present for the past 6 years, reports everyday predominantly at nighttime impacting her sleep.  Seem to improve during the day.  Previously evaluated by neurology, felt migrainous in nature.  Trialed Topamax and amitriptyline without success, not currently taking either.  Normal brain MRI in 2017.  Has not tried anything to make it better recently.  Syncopal episodes/chest pain: Reports a several year history of random episodes of feeling palpitations and dizzy with subsequent diaphoresis/vomiting and then loss of consciousness.  She also sometimes will have chest pain with these episodes, reports she feels tender on bilateral upper breasts for the past few years.  Daughter reports last time this happened was last month, states that happens "often" but is unable to communicate specifically how often.  Not sure how long she is "out " for, but daughter reports she is nonresponsive during these times.  Does not seem related to position changes or moments of acute distress.  Admitted for AMS in 2017 with negative work-up at that time including laboratory/normal brain MRI/normal EEG.  No leg swelling.  No previous MI or CVA.  Daughter is requesting disability papers to be filled out to document that she cannot work at this time.  She has been living in [place] with her son for the past several months however because she was not doing well her daughter brought her back down here to see her medical providers.  She also reports  concern for chronic abdominal pain (many years) and leg pains.  She currently is only taking famotidine, Zofran, and Bentyl.  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  OBJECTIVE:   BP (!) 150/98    Pulse 81    Temp 98.3 F (36.8 C) (Oral)    Wt 175 lb (79.4 kg)    SpO2 98%    BMI 30.04 kg/m   General: Alert, middle-aged female appears older than stated age, comfortable HEENT: NCAT, MMM, EOMI, PERRLA, symmetric goiter obvious via visual inspection with an additional thyroid growth present superior to the right thyroid Cardiac: RRR no m/g/r appreciated, tender with palpation of entire chest bilaterally without any overt masses felt  Lungs: Clear bilaterally, no increased WOB satting appropriately on room air Abdomen: soft Neuro: Alert, smile symmetrical and can follow commands.  Answers questions appropriately with interpreter.  EOMI, PERRLA.  Able to move all extremities spontaneously and equally, unremarkable gait walking in/out room.  Sensation to light touch intact throughout. Ext: Warm, dry, 2+ radial pulses  ASSESSMENT/PLAN:   Syncope Chronic, recurrent.  Unclear etiology, main differential including arrhythmia, vasovagal, orthostasis, vertebrobasilar syndrome, thyroid dysfunction, outflow obstruction, psychosomatic.  Reassuringly baseline rhythm EKG today showing NSR with occasional PVCs.  With reported prodromal symptoms, suspect vasovagal/orthostasis may be more likely.  Additionally, certainly vertebrobasilar artery syndrome could present similarly and has a known diagnosis of this, however cannot find evidence confirming this diagnosis. Will obtain CMP, TSH/t4 with known goiter, CBC, A1c.  Would also benefit from cardiology referral for  long-term cardiac monitoring and possible CAD assessment (doubt contributing large obstructive CAD due to chronicity), will place today and consider echocardiogram prior  to.  Vertebrobasilar artery syndrome May be contributing to above.  Recommend follow-up with neurology, may need confirmatory CTA/MRA in the future.  Migraines Chronic, persistent.  Grossly neurologically intact on exam.  Given no improvement with Topamax or amitriptyline in the past, will trial Cymbalta nightly (that may aid in help with chronic pains elsewhere and concurrent depression).  Recommended using heat and Tylenol prior to bedtime when they predominantly occur.  Goiter or struma Visually evident and enlarging per patient.  Last U/S in 2017 showing homogeneous growth.  Will obtain TSH/T4 today and order thyroid U/S.  Chronic abdominal pain Did not address today, however does appear to be impacting her appetite and intake.  Due to this could be possibly contributing to #1.  Will discuss on follow-up.  Healthcare maintenance Certainly needs mammogram, never completed before, especially with bilateral breast pains.  Will place order and discuss on follow-up.    Discussed with Dr. Andria Frames.  Follow-up in 1 week or go to the ED if any recurrent syncopal episodes. Should get orthostatic vitals and further discuss paperwork on follow-up.  [name], [place]

## 2019-12-11 NOTE — Patient Instructions (Addendum)
Wonderful to see you today.   Please start taking Cymbalta at night. If tolerating well after a week, can increase to 2 tablets nightly. Please also take Tylenol before bedtime. Use heat on your head.  I have placed orders for thyroid ultrasound and to get a mammogram. Please call to have the mammogram done.  Additionally, I have placed referral to cardiology. Please go to the ED if you have a another episode with loss of consciousness.

## 2019-12-12 LAB — COMPREHENSIVE METABOLIC PANEL
ALT: 17 IU/L (ref 0–32)
AST: 19 IU/L (ref 0–40)
Albumin/Globulin Ratio: 1.1 — ABNORMAL LOW (ref 1.2–2.2)
Albumin: 4 g/dL (ref 3.8–4.8)
Alkaline Phosphatase: 106 IU/L (ref 48–121)
BUN/Creatinine Ratio: 15 (ref 12–28)
BUN: 9 mg/dL (ref 8–27)
Bilirubin Total: 0.5 mg/dL (ref 0.0–1.2)
CO2: 23 mmol/L (ref 20–29)
Calcium: 9.4 mg/dL (ref 8.7–10.3)
Chloride: 104 mmol/L (ref 96–106)
Creatinine, Ser: 0.62 mg/dL (ref 0.57–1.00)
GFR calc Af Amer: 110 mL/min/{1.73_m2} (ref >59)
GFR calc non Af Amer: 96 mL/min/{1.73_m2} (ref >59)
Globulin, Total: 3.6 g/dL (ref 1.5–4.5)
Glucose: 98 mg/dL (ref 65–99)
Potassium: 3.8 mmol/L (ref 3.5–5.2)
Sodium: 140 mmol/L (ref 134–144)
Total Protein: 7.6 g/dL (ref 6.0–8.5)

## 2019-12-12 LAB — CBC WITH DIFFERENTIAL/PLATELET
Basophils Absolute: 0 10*3/uL (ref 0.0–0.2)
Basos: 0 %
EOS (ABSOLUTE): 0.2 10*3/uL (ref 0.0–0.4)
Eos: 3 %
Hematocrit: 42.2 % (ref 34.0–46.6)
Hemoglobin: 14.1 g/dL (ref 11.1–15.9)
Immature Grans (Abs): 0 10*3/uL (ref 0.0–0.1)
Immature Granulocytes: 0 %
Lymphocytes Absolute: 3.2 10*3/uL — ABNORMAL HIGH (ref 0.7–3.1)
Lymphs: 50 %
MCH: 29.8 pg (ref 26.6–33.0)
MCHC: 33.4 g/dL (ref 31.5–35.7)
MCV: 89 fL (ref 79–97)
Monocytes Absolute: 0.5 10*3/uL (ref 0.1–0.9)
Monocytes: 8 %
Neutrophils Absolute: 2.5 10*3/uL (ref 1.4–7.0)
Neutrophils: 39 %
Platelets: 186 10*3/uL (ref 150–450)
RBC: 4.73 x10E6/uL (ref 3.77–5.28)
RDW: 13 % (ref 11.7–15.4)
WBC: 6.4 10*3/uL (ref 3.4–10.8)

## 2019-12-12 LAB — TSH+FREE T4
Free T4: 1.49 ng/dL (ref 0.82–1.77)
TSH: 0.069 u[IU]/mL — ABNORMAL LOW (ref 0.450–4.500)

## 2019-12-13 DIAGNOSIS — R55 Syncope and collapse: Secondary | ICD-10-CM | POA: Insufficient documentation

## 2019-12-14 ENCOUNTER — Encounter: Payer: Self-pay | Admitting: Family Medicine

## 2019-12-14 DIAGNOSIS — G8929 Other chronic pain: Secondary | ICD-10-CM | POA: Insufficient documentation

## 2019-12-14 DIAGNOSIS — G43909 Migraine, unspecified, not intractable, without status migrainosus: Secondary | ICD-10-CM | POA: Insufficient documentation

## 2019-12-14 DIAGNOSIS — Z0000 Encounter for general adult medical examination without abnormal findings: Secondary | ICD-10-CM | POA: Insufficient documentation

## 2019-12-14 NOTE — Assessment & Plan Note (Signed)
Did not address today, however does appear to be impacting her appetite and intake.  Due to this could be possibly contributing to #1.  Will discuss on follow-up.

## 2019-12-14 NOTE — Assessment & Plan Note (Signed)
May be contributing to above.  Recommend follow-up with neurology, may need confirmatory CTA/MRA in the future.

## 2019-12-14 NOTE — Assessment & Plan Note (Signed)
Chronic, persistent.  Grossly neurologically intact on exam.  Given no improvement with Topamax or amitriptyline in the past, will trial Cymbalta nightly (that may aid in help with chronic pains elsewhere and concurrent depression).  Recommended using heat and Tylenol prior to bedtime when they predominantly occur.

## 2019-12-14 NOTE — Assessment & Plan Note (Signed)
Certainly needs mammogram, never completed before, especially with bilateral breast pains.  Will place order and discuss on follow-up.

## 2019-12-14 NOTE — Assessment & Plan Note (Addendum)
Visually evident and enlarging per patient.  Last U/S in 2017 showing homogeneous growth.  Will obtain TSH/T4 today and order thyroid U/S.

## 2019-12-14 NOTE — Assessment & Plan Note (Addendum)
Chronic, recurrent.  Unclear etiology, main differential including arrhythmia, vasovagal, orthostasis, vertebrobasilar syndrome, thyroid dysfunction, outflow obstruction, psychosomatic.  Reassuringly baseline rhythm EKG today showing NSR with occasional PVCs.  With reported prodromal symptoms, suspect vasovagal/orthostasis may be more likely.  Additionally, certainly vertebrobasilar artery syndrome could present similarly and has a known diagnosis of this, however cannot find evidence confirming this diagnosis. Will obtain CMP, TSH/t4 with known goiter, CBC, A1c.  Would also benefit from cardiology referral for long-term cardiac monitoring and possible CAD assessment (doubt contributing large obstructive CAD due to chronicity), will place today and consider echocardiogram prior to.

## 2019-12-17 ENCOUNTER — Encounter: Payer: Self-pay | Admitting: Family Medicine

## 2019-12-17 ENCOUNTER — Ambulatory Visit (INDEPENDENT_AMBULATORY_CARE_PROVIDER_SITE_OTHER): Payer: Medicaid Other | Admitting: Family Medicine

## 2019-12-17 ENCOUNTER — Other Ambulatory Visit: Payer: Self-pay

## 2019-12-17 VITALS — BP 142/92 | HR 78 | Ht 64.0 in | Wt 173.0 lb

## 2019-12-17 DIAGNOSIS — G8929 Other chronic pain: Secondary | ICD-10-CM | POA: Diagnosis not present

## 2019-12-17 DIAGNOSIS — K7460 Unspecified cirrhosis of liver: Secondary | ICD-10-CM

## 2019-12-17 DIAGNOSIS — E049 Nontoxic goiter, unspecified: Secondary | ICD-10-CM | POA: Diagnosis not present

## 2019-12-17 DIAGNOSIS — R55 Syncope and collapse: Secondary | ICD-10-CM | POA: Diagnosis not present

## 2019-12-17 DIAGNOSIS — G43709 Chronic migraine without aura, not intractable, without status migrainosus: Secondary | ICD-10-CM

## 2019-12-17 DIAGNOSIS — G43011 Migraine without aura, intractable, with status migrainosus: Secondary | ICD-10-CM | POA: Diagnosis not present

## 2019-12-17 DIAGNOSIS — R1013 Epigastric pain: Secondary | ICD-10-CM

## 2019-12-17 DIAGNOSIS — R109 Unspecified abdominal pain: Secondary | ICD-10-CM

## 2019-12-17 MED ORDER — DULOXETINE HCL 20 MG PO CPEP: 20.0000 mg | ORAL_CAPSULE | Freq: Every day | ORAL | 1 refills | Status: DC

## 2019-12-17 NOTE — Patient Instructions (Addendum)
Call [facility] to start the N-648 process. [phone]   Please make sure you get the medication, can try ice on neck/head to help. If any further passing out episodes--please go to the ED.

## 2019-12-17 NOTE — Progress Notes (Signed)
    SUBJECTIVE:   CHIEF COMPLAINT / HPI: Follow up   In person [place] interpreter was used for the duration of the visit.  Stephanie Swanson is a 64 year old female presenting with her daughter and interpreter for follow-up and to discuss the following:  Headaches: Still present. They have been using Tylenol with little success. Have not been able to pick up the Cymbalta yet.  Recurrent syncope: No episodes for the past month and no concerns since previous visit. Labs collected last time unremarkable, subclinical hyperthyroidism and awaiting thyroid ultrasound due to enlarging goiter.  Dyspepsia: Predominantly epigastric and burning in nature. Chronic, years. Currently using Pepcid and Zofran due to concurrent nausea with some help. Associated decrease in appetite and subsequent weight loss. Can't tell it is worse after certain foods, but is worse after eating. Does have a history of treated hepatitis C with liver cirrhosis, last RUQ ultrasound in 05/2017 showing a 9 mm hyperechoic nodular density in the right lobe of the liver and suggested MRI however this was not completed. Previous H pylori treated. Had an EGD/colonoscopy in 2017 for same complaints and unremarkable, recommended gastroparesis diet at that time. Last seen by GI in 2017.  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  OBJECTIVE:   BP (!) 142/92   Pulse 78   Ht 5\' 4"  (1.626 m)   Wt 173 lb (78.5 kg)   SpO2 98%   BMI 29.70 kg/m   Orthostatic vitals: Supine 141/78, heart rate 75 Sitting 144/71, heart rate 70 Standing 145/75, heart rate 80  General: Alert, NAD HEENT: NCAT, MMM Cardiac: RRR no m/g/r, still tender to palpation of entire anterior chest Lungs: Clear bilaterally, no increased WOB  Abdomen: soft, tender to palpation of epigastrium and right upper quadrant with negative Murphy sign, nondistended, normoactive bowel sounds, no fluid  wave appreciated Msk: Moves all extremities spontaneously  Ext: Warm, dry, 2+ distal pulses, no edema   ASSESSMENT/PLAN:   Epigastric discomfort Chronic dyspepsia now associated with worsening appetite and weight loss. Pepcid and Bentyl with minimal relief, trial of PPI and H. pylori treatment previously without improvement. Will obtain repeat RUQ U/S given known cirrhosis and previous abnormal liver nodule in case this is contributing. Will refer back to GI for further assistance.   Cirrhosis ([place]) RUQ U/S as above, consider screening EGD for varices and reevaluation of dyspepsia as above per GI.  Goiter or struma Thyroid U/S scheduled for 8/23.   Syncope Chronic, recurrent with no further episodes in the past one month. Reassuring labs last visit and orthostatic vitals unremarkable today. Awaiting cardiology referral.  Migraines Provided with correct [facility] address to pick up Cymbalta helpful this will help with headaches and chronic pain. Tylenol with ice/heat as needed.    Additionally requesting disability paperwork, will start working on this with the assistance of her PCP, Dr. Owens Shark. Recommended meeting with [facility] to start this process.   Follow-up in 2 weeks for above or sooner if needed  [name], [place]

## 2019-12-19 ENCOUNTER — Encounter: Payer: Self-pay | Admitting: Family Medicine

## 2019-12-19 NOTE — Assessment & Plan Note (Signed)
Thyroid U/S scheduled for 8/23.

## 2019-12-19 NOTE — Assessment & Plan Note (Signed)
RUQ U/S as above, consider screening EGD for varices and reevaluation of dyspepsia as above per GI.

## 2019-12-19 NOTE — Assessment & Plan Note (Addendum)
Chronic dyspepsia now associated with worsening appetite and weight loss. Pepcid and Bentyl with minimal relief, trial of PPI and H. pylori treatment previously without improvement. Will obtain repeat RUQ U/S given known cirrhosis and previous abnormal liver nodule in case this is contributing. Will refer back to GI for further assistance.

## 2019-12-19 NOTE — Assessment & Plan Note (Signed)
Provided with correct [facility] address to pick up Cymbalta helpful this will help with headaches and chronic pain. Tylenol with ice/heat as needed.

## 2019-12-19 NOTE — Assessment & Plan Note (Signed)
Chronic, recurrent with no further episodes in the past one month. Reassuring labs last visit and orthostatic vitals unremarkable today. Awaiting cardiology referral.

## 2019-12-23 ENCOUNTER — Encounter: Payer: Self-pay | Admitting: Family Medicine

## 2019-12-23 ENCOUNTER — Ambulatory Visit
Admission: RE | Admit: 2019-12-23 | Discharge: 2019-12-23 | Disposition: A | Discharge status: Home / Self Care | Payer: Medicaid Other | Source: Ambulatory Visit | Attending: Family Medicine | Admitting: Family Medicine

## 2019-12-23 DIAGNOSIS — R109 Unspecified abdominal pain: Secondary | ICD-10-CM

## 2019-12-23 DIAGNOSIS — E041 Nontoxic single thyroid nodule: Secondary | ICD-10-CM | POA: Insufficient documentation

## 2019-12-23 DIAGNOSIS — E049 Nontoxic goiter, unspecified: Secondary | ICD-10-CM

## 2019-12-23 DIAGNOSIS — G8929 Other chronic pain: Secondary | ICD-10-CM

## 2019-12-31 ENCOUNTER — Encounter: Payer: Self-pay | Admitting: Physician Assistant

## 2019-12-31 ENCOUNTER — Ambulatory Visit (INDEPENDENT_AMBULATORY_CARE_PROVIDER_SITE_OTHER): Payer: Medicaid Other | Admitting: Family Medicine

## 2019-12-31 ENCOUNTER — Other Ambulatory Visit: Payer: Self-pay

## 2019-12-31 VITALS — BP 138/75 | HR 71 | Ht 64.57 in | Wt 171.8 lb

## 2019-12-31 DIAGNOSIS — E041 Nontoxic single thyroid nodule: Secondary | ICD-10-CM | POA: Diagnosis not present

## 2019-12-31 DIAGNOSIS — Z0000 Encounter for general adult medical examination without abnormal findings: Secondary | ICD-10-CM

## 2019-12-31 DIAGNOSIS — G8929 Other chronic pain: Secondary | ICD-10-CM

## 2019-12-31 DIAGNOSIS — R55 Syncope and collapse: Secondary | ICD-10-CM

## 2019-12-31 DIAGNOSIS — G43011 Migraine without aura, intractable, with status migrainosus: Secondary | ICD-10-CM | POA: Diagnosis not present

## 2019-12-31 DIAGNOSIS — R413 Other amnesia: Secondary | ICD-10-CM | POA: Diagnosis not present

## 2019-12-31 DIAGNOSIS — K7460 Unspecified cirrhosis of liver: Secondary | ICD-10-CM | POA: Diagnosis not present

## 2019-12-31 DIAGNOSIS — R109 Unspecified abdominal pain: Secondary | ICD-10-CM | POA: Diagnosis not present

## 2019-12-31 DIAGNOSIS — E042 Nontoxic multinodular goiter: Secondary | ICD-10-CM | POA: Diagnosis not present

## 2019-12-31 MED ORDER — DULOXETINE HCL 20 MG PO CPEP: 20.0000 mg | ORAL_CAPSULE | Freq: Every day | ORAL | 1 refills | Status: AC

## 2019-12-31 NOTE — Patient Instructions (Addendum)
Wonderful to see you today! Please keep the appointment with cardiology.    [facility] (stomach doctors)  Address: [address], [place], [place] [address] Phone: [phone]  [facility] neurology  Address: [address], [place], [place] [address] Phone: [phone]

## 2019-12-31 NOTE — Progress Notes (Signed)
SUBJECTIVE:   CHIEF COMPLAINT / HPI: F/u   In person [place] interpreter was present for the duration of the visit.  Merrilyn is a 64 year old female presenting with her daughter to discuss the following:  Migraines: Started Cymbalta 2 weeks ago, notable improvement in severity and frequency.  Sleeping better with this medication.  Needs a refill.  Has not called neurology yet for this or the below concerns.  Cirrhosis/dyspepsia: Chronic.  Unchanged from previous visit.  Discussed recent right upper quadrant ultrasound results during visit showing cirrhosis without any residual nodule present previously.  Does show some cholelithiasis with gallbladder wall thickening.  She feels like her belly pain is worse after rice, not particularly greasy or fatty foods.  Seems to hurt all the time.  Has not called back GI to schedule an appointment.  Goiter: Discussed recent thyroid U/S results showing 2 large thyroid nodules.  Discussed right thyroid nodule meeting criteria for biopsy.  Compared to thyroid U/S in 2016, no nodules were present at that time.  Syncope: Daughter reports she had another episode while getting her ultrasounds done last week.  States they stayed at the ultrasound suite for 2 hours.  Daughter was not there so she is not entirely sure what happened, states that she was not instructed to take her mother to the ED.  Did not eat anything prior to ultrasound the day.  Disability paperwork: Has not called the [facility] law clinic yet.  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, migraines, liver cirrhosis with chronic hepatitis Cpreviously treated, chronic abdominal pain  OBJECTIVE:   BP 138/75   Pulse 71   Ht 5' 4.57" (1.64 m)   Wt 171 lb 12.8 oz (77.9 kg)   SpO2 97%   BMI 28.97 kg/m   General: Alert, NAD HEENT: NCAT, MMM, enlarged goiter with R lobe >L  Cardiac: RRR no m/g/r Lungs: Clear bilaterally, no increased WOB  Abdomen: soft, tender in epigastrium  without rebounding or guarding, no RUQ tenderness or elsewhere Msk: Moves all extremities spontaneously  Ext: Warm, dry, 2+ radial pulses bilaterally Psych: Oriented to person and place only (knows building), does not know birthdate, year, or state.  ASSESSMENT/PLAN:   Intractable migraine without aura and with status migrainosus Improved with Cymbalta.  Will continue as is nightly, refill sent.  Thyroid nodule Right and left nodules present.  Recommended biopsy for right nodule, given significant growth we will proceed with FNA biopsy, order placed as patient and daughter are in agreement.  Additionally will need annual U/S for continued surveillance x5 years.   Chronic abdominal pain Currently on Pepcid and Bentyl.  Scheduled GI follow-up for patient during visit, 10/6 at [facility] for further evaluation.  Suspect likely functional given duration and type of pain, could consider surgical referral in the future for evaluation of cholelithiasis however do not believe this is the precipitant of her pain.  Syncope Chronic and recurrent.  Likely multifactorial.  Daughter reports recent episode while at the ultrasound suite, unclear history and did not seek further evaluation after this.  Initial cardiology appointment scheduled for 9/14.  Will additionally schedule neurology follow-up due to possible PMH of vertebral basilar artery syndrome that may be contributing.  Healthcare maintenance Mammogram order placed, will call breast center to see if this can be scheduled due to language barrier.  Memory loss or impairment Oriented to person and place only at baseline.  Daughter reports this has been persistent for several years.  Will discuss further  on follow-up and can consider scheduling geriatric clinic appointment.    Additionally reemphasized necessity to schedule an appointment with [facility] law clinic for prescreening and to continue the [phone] process for disability.  Discussed this will need  to be completed prior to paperwork completion through our clinic.  Follow-up in 1 month or sooner if needed.  ED precautions discussed.  [name], [place]

## 2020-01-04 ENCOUNTER — Encounter: Payer: Self-pay | Admitting: Family Medicine

## 2020-01-04 DIAGNOSIS — R413 Other amnesia: Secondary | ICD-10-CM | POA: Insufficient documentation

## 2020-01-04 NOTE — Assessment & Plan Note (Signed)
Mammogram order placed, will call breast center to see if this can be scheduled due to language barrier.

## 2020-01-04 NOTE — Assessment & Plan Note (Addendum)
Chronic and recurrent.  Likely multifactorial.  Daughter reports recent episode while at the ultrasound suite, unclear history and did not seek further evaluation after this.  Initial cardiology appointment scheduled for 9/14.  Will additionally schedule neurology follow-up due to possible PMH of vertebral basilar artery syndrome that may be contributing.

## 2020-01-04 NOTE — Assessment & Plan Note (Signed)
Right and left nodules present.  Recommended biopsy for right nodule, given significant growth we will proceed with FNA biopsy, order placed as patient and daughter are in agreement.  Additionally will need annual U/S for continued surveillance x5 years.

## 2020-01-04 NOTE — Assessment & Plan Note (Signed)
Improved with Cymbalta.  Will continue as is nightly, refill sent.

## 2020-01-04 NOTE — Assessment & Plan Note (Signed)
Oriented to person and place only at baseline.  Daughter reports this has been persistent for several years.  Will discuss further on follow-up and can consider scheduling geriatric clinic appointment.

## 2020-01-04 NOTE — Assessment & Plan Note (Addendum)
Currently on Pepcid and Bentyl.  Scheduled GI follow-up for patient during visit, 10/6 at [facility] for further evaluation.  Suspect likely functional given duration and type of pain, could consider surgical referral in the future for evaluation of cholelithiasis however do not believe this is the precipitant of her pain.

## 2020-01-07 ENCOUNTER — Ambulatory Visit (HOSPITAL_COMMUNITY)
Admission: RE | Admit: 2020-01-07 | Discharge: 2020-01-07 | Disposition: A | Discharge status: Home / Self Care | Payer: Medicaid Other | Source: Ambulatory Visit | Attending: Family Medicine | Admitting: Family Medicine

## 2020-01-07 ENCOUNTER — Other Ambulatory Visit: Payer: Self-pay

## 2020-01-07 ENCOUNTER — Telehealth: Payer: Self-pay | Admitting: Family Medicine

## 2020-01-07 ENCOUNTER — Telehealth: Payer: Self-pay | Admitting: *Deleted

## 2020-01-07 DIAGNOSIS — E042 Nontoxic multinodular goiter: Secondary | ICD-10-CM | POA: Insufficient documentation

## 2020-01-07 MED ORDER — LIDOCAINE HCL (PF) 1 % IJ SOLN
INTRAMUSCULAR | Status: AC
  Filled 2020-01-07: qty 30

## 2020-01-07 NOTE — Telephone Encounter (Signed)
Pt is requesting disability paperwork submitted two months ago because she is moving back to [place].  According to notes in chart, patient was advised to contact Lysle Rubens to start the paperwork.  Reviewed with JF.

## 2020-01-07 NOTE — Telephone Encounter (Signed)
Dr. Ruben Im you still have this blank form?  Unfortunately, at this time, we cannot complete this document. We recommend seeking immigrant law services for assistance to start the  process and submission of the N-400 with this.   Thank you, Dorris Singh, MD  [facility] Medicine Teaching Service

## 2020-01-07 NOTE — Chronic Care Management (AMB) (Signed)
   Care Management   Outreach Note  01/07/2020 Name: Stephanie Swanson MRN: [id number] DOB: [birth year]  [name] is a 64 y.o. year old female who is a primary care patient of Martyn Malay, MD. I reached out to [facility] by phone today in response to a referral sent by [name]'s PCP, Martyn Malay, MD.      An unsuccessful telephone outreach was attempted today. The patient was referred to the case management team for assistance with care management and care coordination.   Follow Up Plan: A HIPPA compliant phone message was left for the patient providing contact information and requesting a return call. The care management team will reach out to the patient again over the next 7 days. If patient returns call to provider office, please advise to call [place] at [phone].  [place] [facility]

## 2020-01-08 NOTE — Telephone Encounter (Signed)
Spoke with Dr. Higinio Plan and she has document and will communicate with Dr. Owens Shark.  Stephanie Swanson, [place]

## 2020-01-09 LAB — CYTOLOGY - NON PAP

## 2020-01-12 NOTE — Progress Notes (Deleted)
Cardiology Office Note   Date:  01/12/2020   ID:  Analise Glotfelty, DOB Mar 12, 1956, MRN [id number]  PCP:  Martyn Malay, MD  Cardiologist:   Malala Trenkamp [place], MD   No chief complaint on file.     History of Present Illness: Paddy Thakur is a 64 y.o. female who is seen at the request of Dr Gwendlyn Deutscher for evaluation of Syncope. She has a history of HTN.  Reports a several year history of random episodes of feeling palpitations and dizzy with subsequent diaphoresis/vomiting and then loss of consciousness.  She also sometimes will have chest pain with these episodes, reports she feels tender on bilateral upper breasts for the past few years.     Past Medical History:  Diagnosis Date  . Headache   . Hypertension     No past surgical history on file.   Current Outpatient Medications  Medication Sig Dispense Refill  . DULoxetine (CYMBALTA) 20 MG capsule Take 1 capsule (20 mg total) by mouth at bedtime. 30 capsule 1   No current facility-administered medications for this visit.    Allergies:   Patient has no known allergies.    Social History:  The patient  reports that she has never smoked. She has never used smokeless tobacco. She reports that she does not drink alcohol and does not use drugs.   Family History:  The patient's ***family history is not on file.    ROS:  Please see the history of present illness.   Otherwise, review of systems are positive for {NONE DEFAULTED:18576::"none"}.   All other systems are reviewed and negative.    PHYSICAL EXAM: VS:  There were no vitals taken for this visit. , BMI There is no height or weight on file to calculate BMI. GEN: Well nourished, well developed, in no acute distress  HEENT: normal  Neck: no JVD, carotid bruits, or masses Cardiac: ***RRR; no murmurs, rubs, or gallops,no edema  Respiratory:  clear to auscultation bilaterally, normal work of breathing GI: soft, nontender, nondistended, + BS MS: no deformity or  atrophy  Skin: warm and dry, no rash Neuro:  Strength and sensation are intact Psych: euthymic mood, full affect   EKG:  EKG {ACTION; IS/IS IRJ:18841660} ordered today. The ekg ordered today demonstrates ***   Recent Labs: 12/11/2019: ALT 17; BUN 9; Creatinine, Ser 0.62; Hemoglobin 14.1; Platelets 186; Potassium 3.8; Sodium 140; TSH 0.069    Lipid Panel    Component Value Date/Time   CHOL 175 04/17/2018 1147   TRIG 133 04/17/2018 1147   HDL 40 04/17/2018 1147   CHOLHDL 4.4 04/17/2018 1147   CHOLHDL 3.3 12/13/2015 0544   VLDL 13 12/13/2015 0544   LDLCALC 108 (H) 04/17/2018 1147      Wt Readings from Last 3 Encounters:  12/31/19 171 lb 12.8 oz (77.9 kg)  12/17/19 173 lb (78.5 kg)  12/11/19 175 lb (79.4 kg)      Other studies Reviewed: Additional studies/ records that were reviewed today include: ***. Review of the above records demonstrates: ***   ASSESSMENT AND PLAN:  1.  ***   Current medicines are reviewed at length with the patient today.  The patient {ACTIONS; HAS/DOES NOT HAVE:19233} concerns regarding medicines.  The following changes have been made:  {PLAN; NO CHANGE:13088:s}  Labs/ tests ordered today include: *** No orders of the defined types were placed in this encounter.    Disposition:   FU with *** in {gen number 6-30:160109} {Days to years:10300}  Signed,  Zelig Gacek [place], MD  01/12/2020 10:15 AM    [facility] [address], [place], [place], [address] Phone [phone], Fax [phone]

## 2020-01-14 ENCOUNTER — Ambulatory Visit: Payer: Medicaid Other | Admitting: Cardiology

## 2020-01-15 NOTE — Progress Notes (Signed)
Traci to work with [place], Godfrey Pick, RN for reminder calls for upcoming appointments.

## 2020-01-17 ENCOUNTER — Ambulatory Visit (INDEPENDENT_AMBULATORY_CARE_PROVIDER_SITE_OTHER): Payer: Medicaid Other | Admitting: Family Medicine

## 2020-01-17 ENCOUNTER — Other Ambulatory Visit: Payer: Self-pay

## 2020-01-17 ENCOUNTER — Encounter: Payer: Self-pay | Admitting: Family Medicine

## 2020-01-17 VITALS — BP 152/82 | HR 78 | Wt 171.4 lb

## 2020-01-17 DIAGNOSIS — Z598 Other problems related to housing and economic circumstances: Secondary | ICD-10-CM | POA: Diagnosis not present

## 2020-01-17 DIAGNOSIS — IMO0001 Reserved for inherently not codable concepts without codable children: Secondary | ICD-10-CM | POA: Insufficient documentation

## 2020-01-17 DIAGNOSIS — E041 Nontoxic single thyroid nodule: Secondary | ICD-10-CM

## 2020-01-17 NOTE — Assessment & Plan Note (Addendum)
Signed release of records with front office to transfer medical note/images to her new PCP in [place].  Discussed known medical history with daughter and recommendations for continuation of care including evaluation by cardiology, neurology, and gastroenterology for her complaints discussed in previous visits.  She should additionally work towards hypertensive control, schedule for a mammogram asap, and Pap smear in 06/2020.

## 2020-01-17 NOTE — Assessment & Plan Note (Signed)
FNA of suspicious region showing benign follicular nodule, discussed results with patient and daughter today.

## 2020-01-17 NOTE — Patient Instructions (Addendum)
It was wonderful to see you.  We will try to send records to your new primary care provider.  She has the current diagnoses: Chronic headaches, improved with Cymbalta, normal brain MRI in 2017 Recurrent syncope of unknown origin, EKG WNL and labs previously unremarkable as well Chronic abdominal pain, taking acid relief medication Liver cirrhosis with previous treated hepatitis C Multinodular goiter with recent biopsy showing benign thyroid nodule, low TSH with normal T4 Possible vertebrobasilar artery syndrome Memory loss  Recommend when she establishes a care in [place] with her PCP and that she additionally have further evaluation via cardiology for her passing out episodes, GI for her chronic abdominal pain and liver cirrhosis, and neurology for her possible history of vertebral basilar artery syndrome and chronic headaches with some improvement on Cymbalta.  She is due also for a mammogram and does have chronic bilateral breast pains.

## 2020-01-17 NOTE — Progress Notes (Signed)
    SUBJECTIVE:   CHIEF COMPLAINT / HPI:  Transfer to Stephanie Swanson is a 63 year old female presenting with her daughter to discuss moving back to [place].  An in person [place] interpreter was present for the duration of the visit.  Her daughter reports that her brother is coming to pick up her mother (patient) tomorrow to move back to [place].  Her daughter has a 26-year-old son and does not feel like she can manage the multiple doctors appointments that her mother needs.  She is requesting that our office records to be sent to her new primary care provider.  Patient is excited to see her son again.  She additionally got the biopsy of her thyroid nodule which returned benign.  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, migraines, liver cirrhosis with chronic hepatitis Cpreviously treated, chronic abdominal pain  OBJECTIVE:   BP (!) 152/82   Pulse 78   Wt 171 lb 6.4 oz (77.7 kg)   SpO2 97%   BMI 28.91 kg/m   General: Alert, NAD Lungs: No increased WOB  Msk: Moves all extremities spontaneously, normal gait  Ext: Warm, dry  ASSESSMENT/PLAN:   Moving to new residence Signed release of records with front office to transfer medical note/images to her new PCP in [place].  Discussed known medical history with daughter and recommendations for continuation of care including evaluation by cardiology, neurology, and gastroenterology for her complaints discussed in previous visits.  She should additionally work towards hypertensive control, schedule for a mammogram asap, and Pap smear in 06/2020.   Thyroid nodule FNA of suspicious region showing benign follicular nodule, discussed results with patient and daughter today.   [name], [place]

## 2020-01-28 NOTE — Progress Notes (Signed)
This encounter was created in error - please disregard.  This encounter was created in error - please disregard.

## 2020-02-04 ENCOUNTER — Ambulatory Visit: Payer: Medicaid Other | Admitting: Physician Assistant

## 2020-02-05 ENCOUNTER — Encounter: Payer: Self-pay | Admitting: General Practice

## 2020-02-05 ENCOUNTER — Ambulatory Visit: Payer: Medicaid Other | Admitting: Gastroenterology

## 2020-11-10 ENCOUNTER — Other Ambulatory Visit: Payer: Self-pay | Admitting: Family Medicine

## 2020-11-10 DIAGNOSIS — Z13820 Encounter for screening for osteoporosis: Secondary | ICD-10-CM

## 2020-11-13 ENCOUNTER — Ambulatory Visit: Payer: Medicaid Other

## 2020-11-26 ENCOUNTER — Ambulatory Visit: Payer: Medicaid Other

## 2020-11-26 ENCOUNTER — Other Ambulatory Visit: Payer: Medicaid Other

## 2020-12-01 ENCOUNTER — Other Ambulatory Visit: Payer: Medicaid Other

## 2020-12-01 ENCOUNTER — Ambulatory Visit: Payer: Medicaid Other

## 2021-01-05 ENCOUNTER — Encounter: Payer: Self-pay | Admitting: Gastroenterology
